# Patient Record
Sex: Male | Born: 1950
Health system: Southern US, Community
[De-identification: ages and names within clinical notes are randomized; demographics above are authoritative.]

## PROBLEM LIST (undated history)

## (undated) DIAGNOSIS — I10 Essential (primary) hypertension: Secondary | ICD-10-CM

## (undated) DIAGNOSIS — Z860100 Personal history of colon polyps, unspecified: Secondary | ICD-10-CM

## (undated) DIAGNOSIS — D509 Iron deficiency anemia, unspecified: Secondary | ICD-10-CM

## (undated) DIAGNOSIS — Z8669 Personal history of other diseases of the nervous system and sense organs: Secondary | ICD-10-CM

## (undated) DIAGNOSIS — K449 Diaphragmatic hernia without obstruction or gangrene: Secondary | ICD-10-CM

## (undated) DIAGNOSIS — Z9989 Dependence on other enabling machines and devices: Secondary | ICD-10-CM

## (undated) DIAGNOSIS — K648 Other hemorrhoids: Secondary | ICD-10-CM

## (undated) DIAGNOSIS — K219 Gastro-esophageal reflux disease without esophagitis: Secondary | ICD-10-CM

## (undated) DIAGNOSIS — Z9889 Other specified postprocedural states: Secondary | ICD-10-CM

## (undated) DIAGNOSIS — A630 Anogenital (venereal) warts: Secondary | ICD-10-CM

## (undated) DIAGNOSIS — E785 Hyperlipidemia, unspecified: Secondary | ICD-10-CM

## (undated) DIAGNOSIS — G4733 Obstructive sleep apnea (adult) (pediatric): Secondary | ICD-10-CM

## (undated) DIAGNOSIS — Z973 Presence of spectacles and contact lenses: Secondary | ICD-10-CM

## (undated) DIAGNOSIS — K5790 Diverticulosis of intestine, part unspecified, without perforation or abscess without bleeding: Secondary | ICD-10-CM

## (undated) HISTORY — PX: HEMORRHOID BANDING: SHX5850

## (undated) HISTORY — DX: Iron deficiency anemia, unspecified: D50.9

## (undated) HISTORY — PX: INGUINAL HERNIA REPAIR: SUR1180

## (undated) HISTORY — PX: RETINAL DETACHMENT SURGERY: SHX105

## (undated) HISTORY — DX: Anogenital (venereal) warts: A63.0

## (undated) HISTORY — DX: Diverticulosis of intestine, part unspecified, without perforation or abscess without bleeding: K57.90

## (undated) HISTORY — DX: Other hemorrhoids: K64.8

## (undated) HISTORY — DX: Diaphragmatic hernia without obstruction or gangrene: K44.9

## (undated) HISTORY — PX: COLONOSCOPY WITH ESOPHAGOGASTRODUODENOSCOPY (EGD): SHX5779

## (undated) HISTORY — DX: Gastro-esophageal reflux disease without esophagitis: K21.9

---

## 2003-06-10 HISTORY — PX: SHOULDER ARTHROSCOPY: SHX128

## 2004-10-24 ENCOUNTER — Ambulatory Visit: Payer: Self-pay | Admitting: Family Medicine

## 2005-05-22 ENCOUNTER — Ambulatory Visit: Payer: Self-pay | Admitting: Family Medicine

## 2010-04-25 ENCOUNTER — Ambulatory Visit (HOSPITAL_COMMUNITY): Admission: RE | Admit: 2010-04-25 | Discharge: 2010-04-26 | Payer: Self-pay | Admitting: Ophthalmology

## 2010-07-04 ENCOUNTER — Ambulatory Visit (HOSPITAL_COMMUNITY)
Admission: RE | Admit: 2010-07-04 | Discharge: 2010-07-05 | Payer: Self-pay | Source: Home / Self Care | Attending: Ophthalmology | Admitting: Ophthalmology

## 2010-07-04 LAB — BASIC METABOLIC PANEL
CO2: 22 mEq/L (ref 19–32)
Chloride: 107 mEq/L (ref 96–112)
Creatinine, Ser: 1.02 mg/dL (ref 0.4–1.5)
GFR calc Af Amer: >60 mL/min (ref >60)
Potassium: 4 mEq/L (ref 3.5–5.1)
Sodium: 138 mEq/L (ref 135–145)

## 2010-07-04 LAB — CBC
Hemoglobin: 11.6 g/dL — ABNORMAL LOW (ref 13.0–17.0)
MCH: 25.9 pg — ABNORMAL LOW (ref 26.0–34.0)
Platelets: 247 10*3/uL (ref 150–400)
RBC: 4.48 MIL/uL (ref 4.22–5.81)
WBC: 4.4 10*3/uL (ref 4.0–10.5)

## 2010-07-04 LAB — SURGICAL PCR SCREEN
MRSA, PCR: NEGATIVE
Staphylococcus aureus: NEGATIVE

## 2010-07-06 NOTE — Op Note (Signed)
  Lynch, BRUHL               ACCOUNT NO.:  [id number]  MEDICAL RECORD NO.:  [id number]          PATIENT TYPE:  OIB  LOCATION:  5127                         FACILITY:  [facility]  PHYSICIAN:  Beulah Gandy. Ashley Royalty, M.D. DATE OF BIRTH:  1950/10/29  DATE OF PROCEDURE:  07/04/2010 DATE OF DISCHARGE:                              OPERATIVE REPORT   ADMISSION DIAGNOSIS:  Recurrent rhegmatogenous retinal detachment.  PROCEDURE:  Pars plana vitrectomy and repair of complex traction retinal detachment with retinal photocoagulation, Perfluoron injection, Perfluoron removal, gas-fluid exchange, membrane peel, all in the right eye.  SURGEON:  Beulah Gandy. Ashley Royalty, MD  ASSISTANT:  Rosalie Doctor.  ANESTHESIA:  General.  DETAILS:  Usual prep and drape, 25-gauge trocars placed at 8 and 10 o'clock.  The 20-gauge sclerotomy at 2 o'clock.  Provisc placed on the corneal surface.  The pars plana vitrectomy was begun just behind the crystalline lens.  The vitrectomy was carried posteriorly and some remnants of the skirt of the vitreous were encountered.  These were carefully removed under low suction and rapid cutting.  The retina and macula was detached.  The superior retina was attached.  Perfluoron was injected slowly as the fluid egressed from the edges of the retina.  A horseshoe tear was seen at 9 o'clock, and fluid egressed from this area. As the Perfluoron reattached the retina, the horseshoe tear and other breaks were seen pressed tightly against the scleral buckle.  The endolaser was then positioned in the eye, 1286 burns were placed around the horseshoe tear and of the breaks.  The power was 1000 milliwatts, 1000 microns each, and 0.1 seconds each.  Each of the breaks were well sealed at that point.  The Perfluoron was removed and room air was inserted into the vitreous cavity.  A washout procedure where additional fluid was placed in the posterior segment was performed and then additional  removal of fluid and Perfluoron was performed.  C3F8 in a 16% concentration was exchanged for intravitreal gas.  The instruments were removed from the eye and the wounds were tested and found to be tight. A 9-0 nylon was used to close the sclerotomy at 2 o'clock.  The conjunctiva was closed with wet-field cautery.  Polymyxin and gentamicin were irrigated 15 ounces space.  Atropine solution was applied. Marcaine was injected around the globe for postop pain.  Closing pressure was 10 with a Baer keratometer, Decadron 10 mg was injected to the lower subconjunctival space.  The patient was awakened and taken to recovery in satisfactory condition.  COMPLICATIONS:  None.  DURATION:  One and a half hours.     Beulah Gandy. Ashley Royalty, M.D.     JDM/MEDQ  D:  07/04/2010  T:  07/05/2010  Job:  [id number]  Electronically Signed by Alan Mulder M.D. on 07/06/2010 25:36:64 AM

## 2010-08-20 LAB — BASIC METABOLIC PANEL
BUN: 13 mg/dL (ref 6–23)
Chloride: 106 mEq/L (ref 96–112)
GFR calc Af Amer: >60 mL/min (ref >60)
GFR calc non Af Amer: >60 mL/min (ref >60)
Potassium: 3.9 mEq/L (ref 3.5–5.1)
Sodium: 137 mEq/L (ref 135–145)

## 2010-08-20 LAB — CBC
HCT: 38.5 % — ABNORMAL LOW (ref 39.0–52.0)
MCV: 80.5 fL (ref 78.0–100.0)
RBC: 4.78 MIL/uL (ref 4.22–5.81)
RDW: 14.9 % (ref 11.5–15.5)
WBC: 4.4 10*3/uL (ref 4.0–10.5)

## 2010-08-20 LAB — SURGICAL PCR SCREEN: MRSA, PCR: NEGATIVE

## 2011-01-15 ENCOUNTER — Encounter (INDEPENDENT_AMBULATORY_CARE_PROVIDER_SITE_OTHER): Payer: BC Managed Care – PPO | Admitting: Ophthalmology

## 2011-01-15 DIAGNOSIS — H538 Other visual disturbances: Secondary | ICD-10-CM

## 2011-01-15 DIAGNOSIS — H33009 Unspecified retinal detachment with retinal break, unspecified eye: Secondary | ICD-10-CM

## 2011-01-15 DIAGNOSIS — H43819 Vitreous degeneration, unspecified eye: Secondary | ICD-10-CM

## 2011-01-15 DIAGNOSIS — H33309 Unspecified retinal break, unspecified eye: Secondary | ICD-10-CM

## 2011-04-11 ENCOUNTER — Encounter (INDEPENDENT_AMBULATORY_CARE_PROVIDER_SITE_OTHER): Payer: BC Managed Care – PPO | Admitting: Ophthalmology

## 2011-04-11 DIAGNOSIS — H33009 Unspecified retinal detachment with retinal break, unspecified eye: Secondary | ICD-10-CM

## 2011-04-11 DIAGNOSIS — H43819 Vitreous degeneration, unspecified eye: Secondary | ICD-10-CM

## 2011-04-11 DIAGNOSIS — H35359 Cystoid macular degeneration, unspecified eye: Secondary | ICD-10-CM

## 2011-04-18 ENCOUNTER — Encounter (INDEPENDENT_AMBULATORY_CARE_PROVIDER_SITE_OTHER): Payer: BC Managed Care – PPO | Admitting: Ophthalmology

## 2011-06-04 ENCOUNTER — Encounter (HOSPITAL_BASED_OUTPATIENT_CLINIC_OR_DEPARTMENT_OTHER): Payer: Self-pay | Admitting: *Deleted

## 2011-06-04 NOTE — Progress Notes (Signed)
To bring cpap and will use post op 

## 2011-06-05 NOTE — H&P (Signed)
  Date of examination:  05-19-11  Indication for surgery: 60 yo man with exotropia,excyclotropia, and horizontal and torsional diplopia, which by history developed following a scleral buckle procedure on the right eye in 1/12 for a retinal detachment, admitted for eye muscle surgery to straighten the eyes and relieve diplopia.  Pertinent past medical history:  Past Medical History  Diagnosis Date  . Detached retina, bilateral   . Detached retina, right 1/12  . Hypertension   . Hyperlipemia   . Sleep apnea     uses cpap    Pertinent ocular history:  As above. Pt has also undergone laser treatment to the left eye for a "partial detachment," and cataract extraction/lens implantation to the right eye 5/12.  Hx myopia.  Wears toric CL OS; chooses not to wear CL OD because it "enhances the double vision."  Social:  Pilot  Pertinent family history: History reviewed. No pertinent family history.  General:  Healthy appearing patient in no distress.    Eyes:    Acuity cc  [place]  OD 20/60  OS 20/30  Pupil:  OD trace irreg, trace reactive, no APD  External: Within normal limits     Anterior segment: Within normal limits   PCIOL OD.  Lens clear OS  Motility:   RXT = 30, RXT' = 40.  Small "V". DMR 10 degrees excyclo.  1- lim of elev OD.  Rots   o/w nl  Fundus: Deferred--followed by Ashley Royalty  Refraction:   [place]  OD +2.50 + 0.50 x 122 = 20/60  OS wears CL, ? power  Heart: Regular rate and rhythm without murmur     Lungs: Clear to auscultation     Abdomen: Soft, nontender, normal bowel sounds     Impression:XT, CI type, with excyclotropia but no hypertropia.  S/p intrascleral buckle per Ashley Royalty, so the buckle should not be interfering with the EOM's.  Presume the XT is sensory; can't explain apparent RSO weakness with significant torsion but minimal hypertropia; no history of head trauma (for example) to suggest bilateral SO palsy.  Plan: RLR recess and RMR resect for XT; Artelia Laroche for  torsion.  May transpose RLR inferiorly and RMR superiorly for additional torsional effect.  Have explained to pt that the torsion and the incomitance in this case mean that it is exceedingly unlikely that he will have a large field of single binocular vision.  Hope for SBV in primary and in reading position.  Even that may take prism and/or > 1 surgery.  Could end up needing opaque CL to occlude OD.  Pt understands and wishes to proceed.  Shara Blazing

## 2011-06-06 ENCOUNTER — Ambulatory Visit (HOSPITAL_BASED_OUTPATIENT_CLINIC_OR_DEPARTMENT_OTHER): Payer: BC Managed Care – PPO | Admitting: Anesthesiology

## 2011-06-06 ENCOUNTER — Encounter (HOSPITAL_BASED_OUTPATIENT_CLINIC_OR_DEPARTMENT_OTHER): Payer: Self-pay | Admitting: Anesthesiology

## 2011-06-06 ENCOUNTER — Encounter (HOSPITAL_BASED_OUTPATIENT_CLINIC_OR_DEPARTMENT_OTHER)
Admission: RE | Disposition: A | Discharge status: Home / Self Care | Payer: Self-pay | Source: Ambulatory Visit | Attending: Ophthalmology

## 2011-06-06 ENCOUNTER — Encounter (HOSPITAL_BASED_OUTPATIENT_CLINIC_OR_DEPARTMENT_OTHER): Payer: Self-pay

## 2011-06-06 ENCOUNTER — Ambulatory Visit (HOSPITAL_BASED_OUTPATIENT_CLINIC_OR_DEPARTMENT_OTHER)
Admission: RE | Admit: 2011-06-06 | Discharge: 2011-06-06 | Disposition: A | Discharge status: Home / Self Care | Payer: BC Managed Care – PPO | Source: Ambulatory Visit | Attending: Ophthalmology | Admitting: Ophthalmology

## 2011-06-06 DIAGNOSIS — IMO0002 Reserved for concepts with insufficient information to code with codable children: Secondary | ICD-10-CM | POA: Insufficient documentation

## 2011-06-06 DIAGNOSIS — E669 Obesity, unspecified: Secondary | ICD-10-CM | POA: Insufficient documentation

## 2011-06-06 DIAGNOSIS — H5010 Unspecified exotropia: Secondary | ICD-10-CM | POA: Insufficient documentation

## 2011-06-06 DIAGNOSIS — I10 Essential (primary) hypertension: Secondary | ICD-10-CM | POA: Insufficient documentation

## 2011-06-06 HISTORY — DX: Essential (primary) hypertension: I10

## 2011-06-06 HISTORY — PX: STRABISMUS SURGERY: SHX218

## 2011-06-06 LAB — POCT I-STAT, CHEM 8
BUN: 14 mg/dL (ref 6–23)
Hemoglobin: 12.2 g/dL — ABNORMAL LOW (ref 13.0–17.0)
Potassium: 3.9 mEq/L (ref 3.5–5.1)
Sodium: 140 mEq/L (ref 135–145)
TCO2: 25 mmol/L (ref 0–100)

## 2011-06-06 SURGERY — REPAIR STRABISMUS
Anesthesia: General | Laterality: Right | Wound class: Clean

## 2011-06-06 MED ORDER — PROPOFOL 10 MG/ML IV EMUL
INTRAVENOUS | Status: DC | PRN
  Administered 2011-06-06: 200 mg via INTRAVENOUS

## 2011-06-06 MED ORDER — ONDANSETRON HCL 4 MG/2ML IJ SOLN: 4.0000 mg | Freq: Once | INTRAMUSCULAR | Status: DC | PRN

## 2011-06-06 MED ORDER — PHENYLEPHRINE HCL 2.5 % OP SOLN
OPHTHALMIC | Status: DC | PRN
  Administered 2011-06-06: 2 [drp] via OPHTHALMIC

## 2011-06-06 MED ORDER — LIDOCAINE HCL (CARDIAC) 20 MG/ML IV SOLN
INTRAVENOUS | Status: DC | PRN
  Administered 2011-06-06: 60 mg via INTRAVENOUS

## 2011-06-06 MED ORDER — EPHEDRINE SULFATE 50 MG/ML IJ SOLN
INTRAMUSCULAR | Status: DC | PRN
  Administered 2011-06-06: 15 mg via INTRAVENOUS

## 2011-06-06 MED ORDER — FENTANYL CITRATE 0.05 MG/ML IJ SOLN
INTRAMUSCULAR | Status: DC | PRN
  Administered 2011-06-06 (×2): 25 ug via INTRAVENOUS
  Administered 2011-06-06: 50 ug via INTRAVENOUS
  Administered 2011-06-06: 25 ug via INTRAVENOUS

## 2011-06-06 MED ORDER — TOBRAMYCIN-DEXAMETHASONE 0.3-0.1 % OP OINT
TOPICAL_OINTMENT | OPHTHALMIC | Status: DC | PRN
  Administered 2011-06-06: 1 via OPHTHALMIC

## 2011-06-06 MED ORDER — DEXAMETHASONE SODIUM PHOSPHATE 4 MG/ML IJ SOLN
INTRAMUSCULAR | Status: DC | PRN
  Administered 2011-06-06: 10 mg via INTRAVENOUS

## 2011-06-06 MED ORDER — ONDANSETRON HCL 4 MG/2ML IJ SOLN
INTRAMUSCULAR | Status: DC | PRN
  Administered 2011-06-06: 4 mg via INTRAVENOUS

## 2011-06-06 MED ORDER — KETOROLAC TROMETHAMINE 30 MG/ML IJ SOLN
INTRAMUSCULAR | Status: DC | PRN
  Administered 2011-06-06: 30 mg via INTRAVENOUS

## 2011-06-06 MED ORDER — HYDROMORPHONE HCL PF 1 MG/ML IJ SOLN: 0.2500 mg | INTRAMUSCULAR | Status: DC | PRN

## 2011-06-06 MED ORDER — TROPICAMIDE 1 % OP SOLN: 1.0000 [drp] | Freq: Once | OPHTHALMIC | Status: DC

## 2011-06-06 MED ORDER — ATROPINE SULFATE 0.4 MG/ML IJ SOLN
INTRAMUSCULAR | Status: DC | PRN
  Administered 2011-06-06: 0.4 mg via INTRAVENOUS

## 2011-06-06 MED ORDER — PHENYLEPHRINE HCL 2.5 % OP SOLN
1.0000 [drp] | Freq: Once | OPHTHALMIC | Status: AC
  Administered 2011-06-06: 1 [drp] via OPHTHALMIC

## 2011-06-06 MED ORDER — MORPHINE SULFATE 2 MG/ML IJ SOLN: 0.0500 mg/kg | INTRAMUSCULAR | Status: DC | PRN

## 2011-06-06 MED ORDER — MEPERIDINE HCL 25 MG/ML IJ SOLN: 6.2500 mg | INTRAMUSCULAR | Status: DC | PRN

## 2011-06-06 MED ORDER — LACTATED RINGERS IV SOLN
INTRAVENOUS | Status: DC
  Administered 2011-06-06: 08:00:00 via INTRAVENOUS

## 2011-06-06 MED ORDER — MIDAZOLAM HCL 5 MG/5ML IJ SOLN
INTRAMUSCULAR | Status: DC | PRN
  Administered 2011-06-06: 2 mg via INTRAVENOUS

## 2011-06-06 MED ORDER — CYCLOPENTOLATE HCL 1 % OP SOLN
1.0000 [drp] | OPHTHALMIC | Status: AC
  Administered 2011-06-06 (×2): 1 [drp] via OPHTHALMIC

## 2011-06-06 MED ORDER — BSS IO SOLN
INTRAOCULAR | Status: DC | PRN
  Administered 2011-06-06: 20 mL via INTRAOCULAR

## 2011-06-06 MED ORDER — TOBRAMYCIN-DEXAMETHASONE 0.3-0.1 % OP OINT: TOPICAL_OINTMENT | Freq: Two times a day (BID) | OPHTHALMIC | Status: AC

## 2011-06-06 SURGICAL SUPPLY — 31 items
APL SRG 3 HI ABS STRL LF PLS (MISCELLANEOUS) ×1
APPLICATOR COTTON TIP 6IN STRL (MISCELLANEOUS) ×8 IMPLANT
APPLICATOR DR MATTHEWS STRL (MISCELLANEOUS) ×2 IMPLANT
CAUTERY EYE LOW TEMP 1300F FIN (OPHTHALMIC RELATED) IMPLANT
CLOTH BEACON ORANGE TIMEOUT ST (SAFETY) ×2 IMPLANT
COVER MAYO STAND STRL (DRAPES) ×2 IMPLANT
COVER SURGICAL LIGHT HANDLE (MISCELLANEOUS) ×2 IMPLANT
COVER TABLE BACK 60X90 (DRAPES) ×2 IMPLANT
DRAPE SURG 17X23 STRL (DRAPES) ×4 IMPLANT
DRAPE U-SHAPE 76X120 STRL (DRAPES) IMPLANT
GLOVE BIO SURGEON STRL SZ 6.5 (GLOVE) ×2 IMPLANT
GLOVE BIOGEL M STRL SZ7.5 (GLOVE) ×4 IMPLANT
GOWN BRE IMP PREV XXLGXLNG (GOWN DISPOSABLE) ×2 IMPLANT
GOWN PREVENTION PLUS XLARGE (GOWN DISPOSABLE) ×2 IMPLANT
NS IRRIG 1000ML POUR BTL (IV SOLUTION) ×2 IMPLANT
PACK BASIN DAY SURGERY FS (CUSTOM PROCEDURE TRAY) ×2 IMPLANT
PAD EYE OVAL STERILE LF (GAUZE/BANDAGES/DRESSINGS) IMPLANT
SHEET MEDIUM DRAPE 40X70 STRL (DRAPES) ×2 IMPLANT
SPEAR EYE SURG WECK-CEL (MISCELLANEOUS) ×12 IMPLANT
STRIP CLOSURE SKIN 1/4X4 (GAUZE/BANDAGES/DRESSINGS) IMPLANT
SUT 6 0 SILK T G140 8DA (SUTURE) IMPLANT
SUT MERSILENE 6 0 S14 DA (SUTURE) ×2 IMPLANT
SUT PLAIN 6 0 TG1408 (SUTURE) ×2 IMPLANT
SUT SILK 4 0 C 3 735G (SUTURE) IMPLANT
SUT VICRYL 6 0 S 28 (SUTURE) ×2 IMPLANT
SUT VICRYL ABS 6-0 S29 18IN (SUTURE) ×2 IMPLANT
SYRINGE 10CC LL (SYRINGE) ×2 IMPLANT
TOWEL OR 17X24 6PK STRL BLUE (TOWEL DISPOSABLE) ×2 IMPLANT
TOWEL OR NON WOVEN STRL DISP B (DISPOSABLE) ×2 IMPLANT
TRAY DSU PREP LF (CUSTOM PROCEDURE TRAY) ×2 IMPLANT
WATER STERILE IRR 1000ML POUR (IV SOLUTION) ×2 IMPLANT

## 2011-06-06 NOTE — Brief Op Note (Signed)
06/06/2011  11:41 AM  PATIENT:  Shane Lynch  60 y.o. male  PRE-OPERATIVE DIAGNOSIS:  Exotropia and excyclotropia,  right eye.  Hx scleral buckle  POST-OPERATIVE DIAGNOSIS:  same  PROCEDURE:  Procedure(s): 1.  Right Artelia Laroche (not [place]) 2.  Right lateral rectus muscle recession, 7.0 mm, with full tendon width downshift 3.  Right medial rectus muscle resection, 6.0 mm, with full tendon width upshift.  SURGEON:  Surgeon(s): Pasty Spillers Kymari Lollis  PHYSICIAN ASSISTANT:   ASSISTANTS: none   ANESTHESIA:   general  EBL:  Total I/O In: 2100 [I.V.:2100] Out: -   BLOOD ADMINISTERED:none  DRAINS: none   LOCAL MEDICATIONS USED:  NONE  SPECIMEN:  No Specimen  DISPOSITION OF SPECIMEN:  N/A  COUNTS:  YES  TOURNIQUET:  * No tourniquets in log *  DICTATION: .Note written in EPIC  PLAN OF CARE: Discharge to home after PACU  PATIENT DISPOSITION:  PACU - hemodynamically stable.

## 2011-06-06 NOTE — Anesthesia Preprocedure Evaluation (Addendum)
Anesthesia Evaluation  Patient identified by MRN, date of birth, ID band Patient awake    Reviewed: Allergy & Precautions, H&P , NPO status , Patient's Chart, lab work & pertinent test results  Airway Mallampati: I TM Distance: >3 FB Neck ROM: Full    Dental  (+) Teeth Intact and Dental Advisory Given   Pulmonary  Uses CPAP nightly clear to auscultation  Pulmonary exam normal       Cardiovascular hypertension, Regular Normal    Neuro/Psych    GI/Hepatic   Endo/Other  Morbid obesity  Renal/GU      Musculoskeletal   Abdominal   Peds  Hematology   Anesthesia Other Findings   Reproductive/Obstetrics                      Anesthesia Physical Anesthesia Plan  ASA: III  Anesthesia Plan: General   Post-op Pain Management:    Induction: Intravenous  Airway Management Planned: LMA  Additional Equipment:   Intra-op Plan:   Post-operative Plan: Extubation in OR  Informed Consent: I have reviewed the patients History and Physical, chart, labs and discussed the procedure including the risks, benefits and alternatives for the proposed anesthesia with the patient or authorized representative who has indicated his/her understanding and acceptance.   Dental advisory given  Plan Discussed with: CRNA, Anesthesiologist and Surgeon  Anesthesia Plan Comments:         Anesthesia Quick Evaluation

## 2011-06-06 NOTE — Op Note (Signed)
Preoperative diagnosis:  #1 exotropia #2 excyclotropia  #3 status post scleral buckle to repair retinal detachment, right eye  Postoperative diagnosis: Same  Procedure: #1.  Right Harada ito procedure #2 right lateral rectus muscle recession, 7.0 mm, with full tendon width downshift #3 right medial rectus muscle resection, 6.0 mm, with full tendon width upshift  Surgeon: Pasty Spillers Julien Oscar M.D.  Anesthesia: Gen. (laryngeal mask)  Complications: None  Description of procedure: After preoperative evaluation including informed consent, which included a discussion of the fact that it is extremely unlikely to we'll to achieve single binocular vision over a wide range of positions of gaze, and that the goal is single vision in primary position and in the reading position, and that even that might require further surgery and/or prism, the patient was taken to the operating room where he was identified by me. General anesthesia was induced without difficulty after placement of appropriate monitors. Indirect ophthalmoscopy was performed, showing no torsion of the left eye but significant ex torsion of the right eye. The patient was then prepped and draped in standard sterile fashion. A lid speculum was placed in the right eye.  Through a superotemporal fornix incision through conjunctiva and Tenon's fascia, the right superior rectus muscle was engaged on a series of muscle hooks. This was challenging, because the muscle itself was scarred to sclera into the underlying scleral buckle. It was necessary to the muscle posterior to its actual insertion and then by blunt dissection free the adhesions of the muscle to sclera. After extensive searching it was not possible to find the superior oblique tendon insertion in the usual location. Ultimately it was decided to find the superior oblique tendon nasal to the superior rectus muscle. This was done, and a suture was passed under the superior oblique tendon. The  suture was then drawn under the superior rectus muscle and followed to the insertion of the superior oblique tendon, which was quite posterior and directed posteriorly it was not possible reliably to identify anterior versus posterior fibers of the superior oblique tendon. Thus it was decided to bring the entire tendon anteriorly and temporally, securing the tendon to sclera with a double-armed 60 M[place]ilene suture approximately 4 mm superior and 1 mm posterior to the superior border of the lateral rectus insertion. The lateral rectus muscle was then engaged on a series of muscle hooks. Again, it was necessary to hook the muscle well posterior to the actual insertion and then dissected bluntly between the muscle and the scleral buckle to reach the actual insertion. The muscle was cleared of its surrounding fascial attachments of scar tissue. The tendon was secured with a double-armed 6-0 Vicryl suture, with a double locking bite at each border of the muscle, 1 mm from the insertion. The muscle was disinserted. It was reattached to sclera at a measured distance of 3.5 mm posterior to the current insertion, it just at the anterior border of the scleral buckle, with sutures passed into sclera in crossed swords fashion noted each pole was transposed a full tendon with Inferiorly, so that the superior pole was placed directly posterior to the inferior end of the current insertion and the inferior pole was placed one tendon width inferior to the superior pole. The muscle was drawn up until all slackened and removed. The pole sutures were joined together with a needle driver at a measured distance of 3.5 mm above sclera and were tied together. The muscle was then allowed to hang back until not reached sclera, creating  a hemi-hang back recession of 7.0 mm, with a full tendon width downshift. The conjunctival incision was closed with three 6-0 plain gut sutures. Through a superonasal fornix incision through conjunctiva and  Tenon fascia, the right medial rectus muscle was engaged on a series of muscle hooks and cleared of its surrounding fascial attachments and scar tissue. Again, it was necessary to the muscle well posterior to its actual insertion and dissected bluntly between the muscle and the sclera and the scleral buckle to reach the insertion. The muscle was spread between 2 self-retaining hooks.. A 2 mm bite was taken of the center of the muscle belly at a measured distance of 6.0 mm posterior to the insertion, and a knot was tied securely at this location. The needle at each end of the double-armed sutures passed from the center of the muscle belly to the periphery, parallel to and 6.0 mm posterior to the insertion a double locking bite was placed at each border of the muscle. A resection clamp was placed on the muscle just anterior to the sutures. The muscle was disinserted. It was reattached to sclera 5 mm posterior to the limbus with each pole transposed 1 full tendon width superiorly bypassing each pole suture posteriorly to anteriorly in radial fashion at a location corresponding to the new transposed position, then anteriorly to posteriorly near the center, then posteriorly to anteriorly through the center of the muscle belly and just posterior to the previously placed knot. The muscle was drawn up to the level of the new insertion, taking care to remove all slack.  The suture ends were tied securely. The resection clamp was removed. The portion of the muscle anterior to the sutures was carefully excised. Conjunctiva was closed with two six oh plain gut sutures. Indirect ophthalmoscopy was attempted, to assess the amount of torsional correction had been achieved. The view was hazy and really insufficient to accurately assess portion. TobraDex ointment was placed in the eye. The patient was awakened without difficulty and taken to the recovery room in stable condition, having suffered no intraoperative or immediate  postoperative complications.

## 2011-06-06 NOTE — Anesthesia Procedure Notes (Signed)
Procedure Name: LMA Insertion Performed by: Sharyne Richters Pre-anesthesia Checklist: Patient identified, Timeout performed, Emergency Drugs available, Suction available and Patient being monitored Patient Re-evaluated:Patient Re-evaluated prior to inductionOxygen Delivery Method: Circle System Utilized Preoxygenation: Pre-oxygenation with 100% oxygen Intubation Type: IV induction Ventilation: Mask ventilation with difficulty LMA: LMA flexible inserted LMA Size: 5.0 Number of attempts: 1 Placement Confirmation: breath sounds checked- equal and bilateral and positive ETCO2 Tube secured with: Tape Dental Injury: Teeth and Oropharynx as per pre-operative assessment

## 2011-06-06 NOTE — Anesthesia Postprocedure Evaluation (Signed)
Anesthesia Post Note  Patient: Shane Lynch  Procedure(s) Performed:  REPAIR STRABISMUS  Anesthesia type: General  Patient location: PACU  Post pain: Pain level controlled  Post assessment: Patient's Cardiovascular Status Stable  Last Vitals:  Filed Vitals:   06/06/11 1215  BP: 132/65  Pulse: 78  Temp:   Resp: 16    Post vital signs: Reviewed and stable  Level of consciousness: alert  Complications: No apparent anesthesia complications

## 2011-06-06 NOTE — Transfer of Care (Signed)
Immediate Anesthesia Transfer of Care Note  Patient: Shane Lynch  Procedure(s) Performed:  REPAIR STRABISMUS  Patient Location: PACU  Anesthesia Type: General  Level of Consciousness: awake, alert  and oriented  Airway & Oxygen Therapy: Patient Spontanous Breathing and Patient connected to face mask oxygen  Post-op Assessment: Report given to PACU RN and Post -op Vital signs reviewed and stable  Post vital signs: Reviewed and stable  Complications: No apparent anesthesia complications

## 2011-06-09 ENCOUNTER — Encounter (HOSPITAL_BASED_OUTPATIENT_CLINIC_OR_DEPARTMENT_OTHER): Payer: Self-pay | Admitting: Ophthalmology

## 2011-10-09 ENCOUNTER — Ambulatory Visit (INDEPENDENT_AMBULATORY_CARE_PROVIDER_SITE_OTHER): Payer: BC Managed Care – PPO | Admitting: Ophthalmology

## 2011-10-09 DIAGNOSIS — H33309 Unspecified retinal break, unspecified eye: Secondary | ICD-10-CM

## 2011-10-09 DIAGNOSIS — H43819 Vitreous degeneration, unspecified eye: Secondary | ICD-10-CM

## 2011-10-09 DIAGNOSIS — H2510 Age-related nuclear cataract, unspecified eye: Secondary | ICD-10-CM

## 2011-10-09 DIAGNOSIS — H35379 Puckering of macula, unspecified eye: Secondary | ICD-10-CM

## 2011-10-09 DIAGNOSIS — H33009 Unspecified retinal detachment with retinal break, unspecified eye: Secondary | ICD-10-CM

## 2012-07-14 ENCOUNTER — Ambulatory Visit (INDEPENDENT_AMBULATORY_CARE_PROVIDER_SITE_OTHER): Payer: BC Managed Care – PPO | Admitting: Ophthalmology

## 2012-07-14 DIAGNOSIS — H35039 Hypertensive retinopathy, unspecified eye: Secondary | ICD-10-CM

## 2012-07-14 DIAGNOSIS — H43819 Vitreous degeneration, unspecified eye: Secondary | ICD-10-CM

## 2012-07-14 DIAGNOSIS — H33009 Unspecified retinal detachment with retinal break, unspecified eye: Secondary | ICD-10-CM

## 2012-07-14 DIAGNOSIS — H2510 Age-related nuclear cataract, unspecified eye: Secondary | ICD-10-CM

## 2012-07-14 DIAGNOSIS — I10 Essential (primary) hypertension: Secondary | ICD-10-CM

## 2012-07-14 DIAGNOSIS — H33309 Unspecified retinal break, unspecified eye: Secondary | ICD-10-CM

## 2013-04-09 HISTORY — PX: CATARACT EXTRACTION W/ INTRAOCULAR LENS IMPLANT: SHX1309

## 2013-04-20 ENCOUNTER — Encounter (INDEPENDENT_AMBULATORY_CARE_PROVIDER_SITE_OTHER): Payer: BC Managed Care – PPO | Admitting: Ophthalmology

## 2013-04-20 DIAGNOSIS — H33309 Unspecified retinal break, unspecified eye: Secondary | ICD-10-CM

## 2013-04-20 DIAGNOSIS — H2510 Age-related nuclear cataract, unspecified eye: Secondary | ICD-10-CM

## 2013-04-20 DIAGNOSIS — H43819 Vitreous degeneration, unspecified eye: Secondary | ICD-10-CM

## 2013-04-20 DIAGNOSIS — H33009 Unspecified retinal detachment with retinal break, unspecified eye: Secondary | ICD-10-CM

## 2013-05-20 ENCOUNTER — Encounter (INDEPENDENT_AMBULATORY_CARE_PROVIDER_SITE_OTHER): Payer: BC Managed Care – PPO | Admitting: Ophthalmology

## 2013-06-16 ENCOUNTER — Other Ambulatory Visit (HOSPITAL_COMMUNITY): Payer: Self-pay | Admitting: *Deleted

## 2013-06-16 ENCOUNTER — Encounter (HOSPITAL_COMMUNITY): Payer: Self-pay | Admitting: *Deleted

## 2013-06-16 NOTE — Progress Notes (Signed)
When I called pt for pre-op call, pt was very surprised and upset. He had not been told he was having surgery, states he has not even seen Dr. Ashley RoyaltyMatthews yet. Has an appt with him at 7:30 am Friday. He states that my phone call was the first he had heard anything about surgery and he didn't want me asking questions yet. He asked if I could get in touch with Dr. Ashley RoyaltyMatthews and have him call pt. I attempted to reach Dr. Ashley RoyaltyMatthews, but he is not on call. I called Mr Nelida MeuseMilliner back and told him this. I told him to see Dr. Ashley RoyaltyMatthews in the am and once it was determined that he was going to have surgery, then Dr. Ashley RoyaltyMatthews could give him instructions. Pt then asked if it would be better to go ahead and do the health hx just in case. I told him yes and I would be glad to do it now. He then verified his allergies, meds, medical and surgical hx. He did not want me to go over any instructions at this time. So, none were given.

## 2013-06-17 ENCOUNTER — Ambulatory Visit (HOSPITAL_COMMUNITY)
Admission: RE | Admit: 2013-06-17 | Discharge: 2013-06-18 | Disposition: A | Discharge status: Home / Self Care | Payer: BC Managed Care – PPO | Source: Ambulatory Visit | Attending: Ophthalmology | Admitting: Ophthalmology

## 2013-06-17 ENCOUNTER — Encounter (HOSPITAL_COMMUNITY): Payer: BC Managed Care – PPO | Admitting: Certified Registered Nurse Anesthetist

## 2013-06-17 ENCOUNTER — Ambulatory Visit (HOSPITAL_COMMUNITY): Payer: BC Managed Care – PPO | Admitting: Certified Registered Nurse Anesthetist

## 2013-06-17 ENCOUNTER — Encounter (HOSPITAL_COMMUNITY): Payer: Self-pay

## 2013-06-17 ENCOUNTER — Ambulatory Visit (HOSPITAL_COMMUNITY): Payer: BC Managed Care – PPO

## 2013-06-17 ENCOUNTER — Encounter (INDEPENDENT_AMBULATORY_CARE_PROVIDER_SITE_OTHER): Payer: BC Managed Care – PPO | Admitting: Ophthalmology

## 2013-06-17 ENCOUNTER — Encounter (HOSPITAL_COMMUNITY)
Admission: RE | Disposition: A | Discharge status: Home / Self Care | Payer: Self-pay | Source: Ambulatory Visit | Attending: Ophthalmology

## 2013-06-17 DIAGNOSIS — H33002 Unspecified retinal detachment with retinal break, left eye: Secondary | ICD-10-CM | POA: Diagnosis present

## 2013-06-17 DIAGNOSIS — E785 Hyperlipidemia, unspecified: Secondary | ICD-10-CM | POA: Insufficient documentation

## 2013-06-17 DIAGNOSIS — H33009 Unspecified retinal detachment with retinal break, unspecified eye: Secondary | ICD-10-CM | POA: Insufficient documentation

## 2013-06-17 DIAGNOSIS — G4730 Sleep apnea, unspecified: Secondary | ICD-10-CM | POA: Insufficient documentation

## 2013-06-17 DIAGNOSIS — H35039 Hypertensive retinopathy, unspecified eye: Secondary | ICD-10-CM

## 2013-06-17 DIAGNOSIS — I10 Essential (primary) hypertension: Secondary | ICD-10-CM

## 2013-06-17 DIAGNOSIS — H43819 Vitreous degeneration, unspecified eye: Secondary | ICD-10-CM

## 2013-06-17 HISTORY — PX: SCLERAL BUCKLE: SHX5340

## 2013-06-17 LAB — CBC
HEMATOCRIT: 32 % — AB (ref 39.0–52.0)
Hemoglobin: 9.5 g/dL — ABNORMAL LOW (ref 13.0–17.0)
MCH: 21.4 pg — ABNORMAL LOW (ref 26.0–34.0)
MCHC: 29.7 g/dL — ABNORMAL LOW (ref 30.0–36.0)
MCV: 72.1 fL — ABNORMAL LOW (ref 78.0–100.0)
Platelets: 249 10*3/uL (ref 150–400)
RBC: 4.44 MIL/uL (ref 4.22–5.81)
RDW: 18.5 % — AB (ref 11.5–15.5)
WBC: 5.9 10*3/uL (ref 4.0–10.5)

## 2013-06-17 LAB — BASIC METABOLIC PANEL
BUN: 12 mg/dL (ref 6–23)
CHLORIDE: 115 meq/L — AB (ref 96–112)
CO2: 23 mEq/L (ref 19–32)
CREATININE: 0.88 mg/dL (ref 0.50–1.35)
Calcium: 9.3 mg/dL (ref 8.4–10.5)
Glucose, Bld: 106 mg/dL — ABNORMAL HIGH (ref 70–99)
Potassium: 3.8 mEq/L (ref 3.7–5.3)
Sodium: 122 mEq/L — ABNORMAL LOW (ref 137–147)

## 2013-06-17 LAB — SODIUM: SODIUM: 142 meq/L (ref 137–147)

## 2013-06-17 SURGERY — SCLERAL BUCKLE
Anesthesia: General | Laterality: Left

## 2013-06-17 MED ORDER — CYCLOPENTOLATE HCL 1 % OP SOLN
1.0000 [drp] | OPHTHALMIC | Status: AC | PRN
  Administered 2013-06-17 (×3): 1 [drp] via OPHTHALMIC
  Filled 2013-06-17: qty 2

## 2013-06-17 MED ORDER — CEFAZOLIN SODIUM-DEXTROSE 2-3 GM-% IV SOLR
2.0000 g | INTRAVENOUS | Status: AC
  Administered 2013-06-17: 2 g via INTRAVENOUS
  Filled 2013-06-17: qty 50

## 2013-06-17 MED ORDER — FENTANYL CITRATE 0.05 MG/ML IJ SOLN
INTRAMUSCULAR | Status: DC | PRN
  Administered 2013-06-17 (×5): 50 ug via INTRAVENOUS

## 2013-06-17 MED ORDER — LATANOPROST 0.005 % OP SOLN
1.0000 [drp] | Freq: Every day | OPHTHALMIC | Status: DC
  Filled 2013-06-17: qty 2.5

## 2013-06-17 MED ORDER — HYDROCODONE-ACETAMINOPHEN 5-325 MG PO TABS
1.0000 | ORAL_TABLET | ORAL | Status: DC | PRN
  Administered 2013-06-17 – 2013-06-18 (×2): 2 via ORAL
  Filled 2013-06-17 (×2): qty 2

## 2013-06-17 MED ORDER — HYDROMORPHONE HCL PF 1 MG/ML IJ SOLN
INTRAMUSCULAR | Status: AC
  Filled 2013-06-17: qty 1

## 2013-06-17 MED ORDER — ONDANSETRON HCL 4 MG/2ML IJ SOLN
INTRAMUSCULAR | Status: DC | PRN
  Administered 2013-06-17: 4 mg via INTRAVENOUS

## 2013-06-17 MED ORDER — BACITRACIN-POLYMYXIN B 500-10000 UNIT/GM OP OINT
TOPICAL_OINTMENT | OPHTHALMIC | Status: AC
  Filled 2013-06-17: qty 3.5

## 2013-06-17 MED ORDER — ACETAMINOPHEN 325 MG PO TABS: 325.0000 mg | ORAL_TABLET | ORAL | Status: DC | PRN

## 2013-06-17 MED ORDER — LISINOPRIL-HYDROCHLOROTHIAZIDE 10-12.5 MG PO TABS: 1.0000 | ORAL_TABLET | Freq: Every day | ORAL | Status: DC

## 2013-06-17 MED ORDER — BACITRACIN-POLYMYXIN B 500-10000 UNIT/GM OP OINT
TOPICAL_OINTMENT | OPHTHALMIC | Status: DC | PRN
  Administered 2013-06-17: 1 via OPHTHALMIC

## 2013-06-17 MED ORDER — BSS IO SOLN
INTRAOCULAR | Status: DC | PRN
  Administered 2013-06-17: 15 mL via INTRAOCULAR

## 2013-06-17 MED ORDER — ONDANSETRON HCL 4 MG/2ML IJ SOLN: 4.0000 mg | Freq: Four times a day (QID) | INTRAMUSCULAR | Status: DC | PRN

## 2013-06-17 MED ORDER — TEMAZEPAM 15 MG PO CAPS: 15.0000 mg | ORAL_CAPSULE | Freq: Every evening | ORAL | Status: DC | PRN

## 2013-06-17 MED ORDER — LIDOCAINE HCL (CARDIAC) 20 MG/ML IV SOLN
INTRAVENOUS | Status: DC | PRN
  Administered 2013-06-17: 40 mg via INTRAVENOUS

## 2013-06-17 MED ORDER — NEOSTIGMINE METHYLSULFATE 1 MG/ML IJ SOLN
INTRAMUSCULAR | Status: DC | PRN
  Administered 2013-06-17: 5 mg via INTRAVENOUS

## 2013-06-17 MED ORDER — DOCUSATE SODIUM 100 MG PO CAPS
100.0000 mg | ORAL_CAPSULE | Freq: Two times a day (BID) | ORAL | Status: DC
  Administered 2013-06-17: 100 mg via ORAL
  Filled 2013-06-17: qty 1

## 2013-06-17 MED ORDER — DEXAMETHASONE SODIUM PHOSPHATE 10 MG/ML IJ SOLN
INTRAMUSCULAR | Status: AC
  Filled 2013-06-17: qty 1

## 2013-06-17 MED ORDER — TRIAMCINOLONE ACETONIDE 40 MG/ML IJ SUSP
INTRAMUSCULAR | Status: AC
  Filled 2013-06-17: qty 5

## 2013-06-17 MED ORDER — LISINOPRIL 10 MG PO TABS
10.0000 mg | ORAL_TABLET | Freq: Every day | ORAL | Status: DC
  Filled 2013-06-17: qty 1

## 2013-06-17 MED ORDER — GATIFLOXACIN 0.5 % OP SOLN
1.0000 [drp] | Freq: Four times a day (QID) | OPHTHALMIC | Status: DC
  Filled 2013-06-17: qty 2.5

## 2013-06-17 MED ORDER — BACITRACIN-POLYMYXIN B 500-10000 UNIT/GM OP OINT
1.0000 "application " | TOPICAL_OINTMENT | Freq: Four times a day (QID) | OPHTHALMIC | Status: DC
  Filled 2013-06-17: qty 3.5

## 2013-06-17 MED ORDER — HYDROCHLOROTHIAZIDE 12.5 MG PO CAPS
12.5000 mg | ORAL_CAPSULE | Freq: Every day | ORAL | Status: DC
  Filled 2013-06-17: qty 1

## 2013-06-17 MED ORDER — PROPOFOL 10 MG/ML IV BOLUS
INTRAVENOUS | Status: DC | PRN
  Administered 2013-06-17: 200 mg via INTRAVENOUS

## 2013-06-17 MED ORDER — ATORVASTATIN CALCIUM 20 MG PO TABS
20.0000 mg | ORAL_TABLET | Freq: Every day | ORAL | Status: DC
  Filled 2013-06-17: qty 1

## 2013-06-17 MED ORDER — LIDOCAINE HCL 2 % IJ SOLN
INTRAMUSCULAR | Status: AC
  Filled 2013-06-17: qty 20

## 2013-06-17 MED ORDER — HYPROMELLOSE (GONIOSCOPIC) 2.5 % OP SOLN
OPHTHALMIC | Status: AC
  Filled 2013-06-17: qty 15

## 2013-06-17 MED ORDER — SODIUM CHLORIDE 0.45 % IV SOLN
INTRAVENOUS | Status: DC
  Administered 2013-06-17: 20 mL/h via INTRAVENOUS

## 2013-06-17 MED ORDER — POLYMYXIN B SULFATE 500000 UNITS IJ SOLR
INTRAMUSCULAR | Status: AC
  Filled 2013-06-17: qty 1

## 2013-06-17 MED ORDER — DEXAMETHASONE SODIUM PHOSPHATE 10 MG/ML IJ SOLN
INTRAMUSCULAR | Status: DC | PRN
  Administered 2013-06-17: 10 mg via INTRAVENOUS

## 2013-06-17 MED ORDER — ACETAZOLAMIDE SODIUM 500 MG IJ SOLR
500.0000 mg | Freq: Once | INTRAMUSCULAR | Status: AC
  Administered 2013-06-18: 500 mg via INTRAVENOUS
  Filled 2013-06-17: qty 500

## 2013-06-17 MED ORDER — ACETAZOLAMIDE SODIUM 500 MG IJ SOLR
INTRAMUSCULAR | Status: AC
  Filled 2013-06-17: qty 500

## 2013-06-17 MED ORDER — ACETAZOLAMIDE SODIUM 500 MG IJ SOLR: 500.0000 mg | Freq: Once | INTRAMUSCULAR | Status: DC

## 2013-06-17 MED ORDER — BACITRACIN-POLYMYXIN B 500-10000 UNIT/GM OP OINT: 1.0000 "application " | TOPICAL_OINTMENT | Freq: Four times a day (QID) | OPHTHALMIC | Status: DC

## 2013-06-17 MED ORDER — MORPHINE SULFATE 2 MG/ML IJ SOLN: 1.0000 mg | INTRAMUSCULAR | Status: DC | PRN

## 2013-06-17 MED ORDER — TROPICAMIDE 1 % OP SOLN
1.0000 [drp] | OPHTHALMIC | Status: AC | PRN
  Administered 2013-06-17 (×3): 1 [drp] via OPHTHALMIC
  Filled 2013-06-17: qty 3

## 2013-06-17 MED ORDER — MAGNESIUM HYDROXIDE 400 MG/5ML PO SUSP: 15.0000 mL | Freq: Four times a day (QID) | ORAL | Status: DC | PRN

## 2013-06-17 MED ORDER — BRIMONIDINE TARTRATE 0.2 % OP SOLN
1.0000 [drp] | Freq: Two times a day (BID) | OPHTHALMIC | Status: DC
  Filled 2013-06-17: qty 5

## 2013-06-17 MED ORDER — GATIFLOXACIN 0.5 % OP SOLN
1.0000 [drp] | OPHTHALMIC | Status: AC | PRN
  Administered 2013-06-17 (×3): 1 [drp] via OPHTHALMIC
  Filled 2013-06-17: qty 2.5

## 2013-06-17 MED ORDER — BUPIVACAINE-EPINEPHRINE (PF) 0.25% -1:200000 IJ SOLN
INTRAMUSCULAR | Status: AC
  Filled 2013-06-17: qty 30

## 2013-06-17 MED ORDER — LATANOPROST 0.005 % OP SOLN: 1.0000 [drp] | Freq: Every day | OPHTHALMIC | Status: DC

## 2013-06-17 MED ORDER — BSS IO SOLN
INTRAOCULAR | Status: AC
  Filled 2013-06-17: qty 15

## 2013-06-17 MED ORDER — PHENYLEPHRINE HCL 10 MG/ML IJ SOLN
INTRAMUSCULAR | Status: DC | PRN
  Administered 2013-06-17 (×2): 40 ug via INTRAVENOUS

## 2013-06-17 MED ORDER — BSS PLUS IO SOLN
INTRAOCULAR | Status: AC
  Filled 2013-06-17: qty 500

## 2013-06-17 MED ORDER — TETRACAINE HCL 0.5 % OP SOLN
2.0000 [drp] | Freq: Once | OPHTHALMIC | Status: DC
  Filled 2013-06-17: qty 2

## 2013-06-17 MED ORDER — ROCURONIUM BROMIDE 100 MG/10ML IV SOLN
INTRAVENOUS | Status: DC | PRN
  Administered 2013-06-17: 50 mg via INTRAVENOUS
  Administered 2013-06-17 (×2): 10 mg via INTRAVENOUS

## 2013-06-17 MED ORDER — HEMOSTATIC AGENTS (NO CHARGE) OPTIME
TOPICAL | Status: DC | PRN
  Administered 2013-06-17: 1 via TOPICAL

## 2013-06-17 MED ORDER — MORPHINE SULFATE 2 MG/ML IJ SOLN
1.0000 mg | INTRAMUSCULAR | Status: AC | PRN
  Administered 2013-06-17 – 2013-06-18 (×2): 2 mg via INTRAVENOUS
  Filled 2013-06-17 (×2): qty 1

## 2013-06-17 MED ORDER — MIDAZOLAM HCL 5 MG/5ML IJ SOLN
INTRAMUSCULAR | Status: DC | PRN
  Administered 2013-06-17: 2 mg via INTRAVENOUS

## 2013-06-17 MED ORDER — ATROPINE SULFATE 1 % OP SOLN
OPHTHALMIC | Status: AC
  Filled 2013-06-17: qty 2

## 2013-06-17 MED ORDER — PREDNISOLONE ACETATE 1 % OP SUSP
1.0000 [drp] | Freq: Four times a day (QID) | OPHTHALMIC | Status: DC
  Filled 2013-06-17: qty 1

## 2013-06-17 MED ORDER — SODIUM CHLORIDE 0.45 % IV SOLN: INTRAVENOUS | Status: DC

## 2013-06-17 MED ORDER — BRIMONIDINE TARTRATE 0.2 % OP SOLN: 1.0000 [drp] | Freq: Two times a day (BID) | OPHTHALMIC | Status: DC

## 2013-06-17 MED ORDER — BUPIVACAINE HCL (PF) 0.75 % IJ SOLN
INTRAMUSCULAR | Status: DC | PRN
  Administered 2013-06-17: 10 mL

## 2013-06-17 MED ORDER — GATIFLOXACIN 0.5 % OP SOLN: 1.0000 [drp] | Freq: Four times a day (QID) | OPHTHALMIC | Status: DC

## 2013-06-17 MED ORDER — HYDROCODONE-ACETAMINOPHEN 5-325 MG PO TABS: 1.0000 | ORAL_TABLET | ORAL | Status: DC | PRN

## 2013-06-17 MED ORDER — GLYCOPYRROLATE 0.2 MG/ML IJ SOLN
INTRAMUSCULAR | Status: DC | PRN
  Administered 2013-06-17: 0.2 mg via INTRAVENOUS
  Administered 2013-06-17: 0.6 mg via INTRAVENOUS

## 2013-06-17 MED ORDER — SODIUM CHLORIDE 0.9 % IJ SOLN
INTRAMUSCULAR | Status: AC
  Filled 2013-06-17: qty 10

## 2013-06-17 MED ORDER — HYDROMORPHONE HCL PF 1 MG/ML IJ SOLN
0.2500 mg | INTRAMUSCULAR | Status: DC | PRN
  Administered 2013-06-17 (×2): 0.5 mg via INTRAVENOUS

## 2013-06-17 MED ORDER — TETRACAINE HCL 0.5 % OP SOLN: 2.0000 [drp] | Freq: Once | OPHTHALMIC | Status: DC

## 2013-06-17 MED ORDER — EPINEPHRINE HCL 1 MG/ML IJ SOLN
INTRAMUSCULAR | Status: AC
  Filled 2013-06-17: qty 1

## 2013-06-17 MED ORDER — BISOPROLOL FUMARATE 10 MG PO TABS
20.0000 mg | ORAL_TABLET | Freq: Every day | ORAL | Status: DC
  Filled 2013-06-17: qty 2

## 2013-06-17 MED ORDER — HYALURONIDASE HUMAN 150 UNIT/ML IJ SOLN
INTRAMUSCULAR | Status: AC
  Filled 2013-06-17: qty 1

## 2013-06-17 MED ORDER — PHENYLEPHRINE HCL 2.5 % OP SOLN
1.0000 [drp] | OPHTHALMIC | Status: AC | PRN
  Administered 2013-06-17 (×3): 1 [drp] via OPHTHALMIC
  Filled 2013-06-17: qty 15

## 2013-06-17 MED ORDER — BUPIVACAINE HCL (PF) 0.75 % IJ SOLN
INTRAMUSCULAR | Status: AC
  Filled 2013-06-17: qty 10

## 2013-06-17 MED ORDER — DOCUSATE SODIUM 100 MG PO CAPS: 100.0000 mg | ORAL_CAPSULE | Freq: Two times a day (BID) | ORAL | Status: DC

## 2013-06-17 MED ORDER — SODIUM HYALURONATE 10 MG/ML IO SOLN
INTRAOCULAR | Status: AC
  Filled 2013-06-17: qty 0.85

## 2013-06-17 MED ORDER — SODIUM CHLORIDE 0.9 % IV SOLN
INTRAVENOUS | Status: DC
  Administered 2013-06-17 (×4): via INTRAVENOUS

## 2013-06-17 MED ORDER — GENTAMICIN SULFATE 40 MG/ML IJ SOLN
INTRAMUSCULAR | Status: AC
  Filled 2013-06-17: qty 2

## 2013-06-17 MED ORDER — PREDNISOLONE ACETATE 1 % OP SUSP: 1.0000 [drp] | Freq: Four times a day (QID) | OPHTHALMIC | Status: DC

## 2013-06-17 MED ORDER — SODIUM CHLORIDE 0.9 % IJ SOLN
INTRAMUSCULAR | Status: DC | PRN
  Administered 2013-06-17: 16:00:00

## 2013-06-17 SURGICAL SUPPLY — 71 items
APL SRG 3 HI ABS STRL LF PLS (MISCELLANEOUS) ×6
APPLICATOR DR MATTHEWS STRL (MISCELLANEOUS) ×18 IMPLANT
BALL CTTN LRG ABS STRL LF (GAUZE/BANDAGES/DRESSINGS) ×3
BLADE EYE CATARACT 19 1.4 BEAV (BLADE) IMPLANT
BLADE MVR KNIFE 19G (BLADE) IMPLANT
BLADE SURG 15 STRL LF DISP TIS (BLADE) IMPLANT
BLADE SURG 15 STRL SS (BLADE)
CANNULA ANT CHAM MAIN (OPHTHALMIC RELATED) IMPLANT
CANNULA DUAL BORE 23G (CANNULA) IMPLANT
CORDS BIPOLAR (ELECTRODE) IMPLANT
COTTONBALL LRG STERILE PKG (GAUZE/BANDAGES/DRESSINGS) ×9 IMPLANT
COVER SURGICAL LIGHT HANDLE (MISCELLANEOUS) ×3 IMPLANT
DRAPE INCISE 51X51 W/FILM STRL (DRAPES) ×3 IMPLANT
DRAPE OPHTHALMIC 77X100 STRL (CUSTOM PROCEDURE TRAY) ×3 IMPLANT
ERASER HMR WETFIELD 23G BP (MISCELLANEOUS) IMPLANT
FILTER BLUE MILLIPORE (MISCELLANEOUS) IMPLANT
FILTER STRAW FLUID ASPIR (MISCELLANEOUS) IMPLANT
GAS AUTO FILL CONSTEL (OPHTHALMIC) ×3
GAS AUTO FILL CONSTELLATION (OPHTHALMIC) ×1 IMPLANT
GLOVE SS BIOGEL STRL SZ 6.5 (GLOVE) ×1 IMPLANT
GLOVE SS BIOGEL STRL SZ 7 (GLOVE) ×1 IMPLANT
GLOVE SUPERSENSE BIOGEL SZ 6.5 (GLOVE) ×2
GLOVE SUPERSENSE BIOGEL SZ 7 (GLOVE) ×2
GLOVE SURG 8.5 LATEX PF (GLOVE) ×3 IMPLANT
GOWN STRL NON-REIN LRG LVL3 (GOWN DISPOSABLE) ×6 IMPLANT
IMPL SILICONE (Ophthalmic Related) ×1 IMPLANT
IMPLANT SILICONE (Ophthalmic Related) ×6 IMPLANT
KIT BASIN OR (CUSTOM PROCEDURE TRAY) ×3 IMPLANT
KIT PERFLUORON PROCEDURE 5ML (MISCELLANEOUS) IMPLANT
KIT ROOM TURNOVER OR (KITS) ×3 IMPLANT
KNIFE CRESCENT 1.75 EDGEAHEAD (BLADE) IMPLANT
KNIFE GRIESHABER SHARP 2.5MM (MISCELLANEOUS) ×12 IMPLANT
MASK EYE SHIELD (GAUZE/BANDAGES/DRESSINGS) ×3 IMPLANT
NEEDLE 18GX1X1/2 (RX/OR ONLY) (NEEDLE) ×3 IMPLANT
NEEDLE 25GX 5/8IN NON SAFETY (NEEDLE) IMPLANT
NEEDLE 27GAX1X1/2 (NEEDLE) IMPLANT
NEEDLE HYPO 30X.5 LL (NEEDLE) ×6 IMPLANT
NS IRRIG 1000ML POUR BTL (IV SOLUTION) ×3 IMPLANT
PACK VITRECTOMY CUSTOM (CUSTOM PROCEDURE TRAY) ×3 IMPLANT
PAD ARMBOARD 7.5X6 YLW CONV (MISCELLANEOUS) ×6 IMPLANT
PAD EYE OVAL STERILE LF (GAUZE/BANDAGES/DRESSINGS) ×3 IMPLANT
PAK PIK VITRECTOMY CVS 25GA (OPHTHALMIC) IMPLANT
PROBE LASER ILLUM FLEX CVD 25G (OPHTHALMIC) IMPLANT
REPL STRA BRUSH NEEDLE (NEEDLE) IMPLANT
RESERVOIR BACK FLUSH (MISCELLANEOUS) IMPLANT
ROLLS DENTAL (MISCELLANEOUS) ×6 IMPLANT
SLEEVE SCLERAL TYPE 270 (Ophthalmic Related) ×3 IMPLANT
SPEAR EYE SURG WECK-CEL (MISCELLANEOUS) ×15 IMPLANT
SPONGE SURGIFOAM ABS GEL 12-7 (HEMOSTASIS) ×3 IMPLANT
STOPCOCK 4 WAY LG BORE MALE ST (IV SETS) IMPLANT
SUT CHROMIC 7 0 TG140 8 (SUTURE) ×3 IMPLANT
SUT ETHILON 9 0 TG140 8 (SUTURE) IMPLANT
SUT MERSILENE 4 0 RV 2 (SUTURE) ×9 IMPLANT
SUT SILK 2 0 (SUTURE) ×3
SUT SILK 2-0 18XBRD TIE 12 (SUTURE) ×1 IMPLANT
SUT SILK 4 0 RB 1 (SUTURE) ×3 IMPLANT
SUT VICRYL 7 0 TG140 8 (SUTURE) IMPLANT
SYR 20CC LL (SYRINGE) ×3 IMPLANT
SYR 50ML LL SCALE MARK (SYRINGE) IMPLANT
SYR 5ML LL (SYRINGE) ×3 IMPLANT
SYR BULB 3OZ (MISCELLANEOUS) ×3 IMPLANT
SYR TB 1ML LUER SLIP (SYRINGE) IMPLANT
SYRINGE 10CC LL (SYRINGE) ×3 IMPLANT
TAPE SURG TRANSPORE 1 IN (GAUZE/BANDAGES/DRESSINGS) ×1 IMPLANT
TAPE SURGICAL TRANSPORE 1 IN (GAUZE/BANDAGES/DRESSINGS) ×2
TIRE RTNL 2.5XGRV CNCV 9X (Ophthalmic Related) ×1 IMPLANT
TOWEL OR 17X24 6PK STRL BLUE (TOWEL DISPOSABLE) ×12 IMPLANT
TUBING ART PRESS 12 MALE/MALE (MISCELLANEOUS) IMPLANT
VITREORETINAL VISCODISSEC (MISCELLANEOUS) IMPLANT
WATER STERILE IRR 1000ML POUR (IV SOLUTION) ×3 IMPLANT
WIPE INSTRUMENT VISIWIPE 73X73 (MISCELLANEOUS) ×3 IMPLANT

## 2013-06-17 NOTE — Anesthesia Preprocedure Evaluation (Addendum)
Anesthesia Evaluation  Patient identified by MRN, date of birth, ID band Patient awake    Reviewed: Allergy & Precautions, H&P , NPO status , Patient's Chart, lab work & pertinent test results, reviewed documented beta blocker date and time   Airway Mallampati: III TM Distance: >3 FB Neck ROM: Full    Dental no notable dental hx. (+) Teeth Intact and Dental Advisory Given   Pulmonary sleep apnea and Continuous Positive Airway Pressure Ventilation ,  breath sounds clear to auscultation  Pulmonary exam normal       Cardiovascular hypertension, On Medications and On Home Beta Blockers Rhythm:Regular Rate:Normal     Neuro/Psych negative neurological ROS  negative psych ROS   GI/Hepatic negative GI ROS, Neg liver ROS,   Endo/Other  negative endocrine ROS  Renal/GU negative Renal ROS  negative genitourinary   Musculoskeletal   Abdominal   Peds  Hematology negative hematology ROS (+)   Anesthesia Other Findings   Reproductive/Obstetrics negative OB ROS                          Anesthesia Physical Anesthesia Plan  ASA: III  Anesthesia Plan: General   Post-op Pain Management:    Induction: Intravenous  Airway Management Planned: Oral ETT  Additional Equipment:   Intra-op Plan:   Post-operative Plan: Extubation in OR  Informed Consent: I have reviewed the patients History and Physical, chart, labs and discussed the procedure including the risks, benefits and alternatives for the proposed anesthesia with the patient or authorized representative who has indicated his/her understanding and acceptance.   Dental advisory given  Plan Discussed with: CRNA, Anesthesiologist and Surgeon  Anesthesia Plan Comments:        Anesthesia Quick Evaluation

## 2013-06-17 NOTE — Preoperative (Addendum)
Beta Blockers   Reason not to administer Beta Blockers:Not Applicable  Pt took bisoprolol 0600.

## 2013-06-17 NOTE — Transfer of Care (Signed)
Immediate Anesthesia Transfer of Care Note  Patient: Shane Lynch  Procedure(s) Performed: Procedure(s): SCLERAL BUCKLE WITH ENDOLASER (Left)  Patient Location: PACU  Anesthesia Type:General  Level of Consciousness: awake and alert   Airway & Oxygen Therapy: Patient Spontanous Breathing and Patient connected to face mask oxygen  Post-op Assessment: Report given to PACU RN and Post -op Vital signs reviewed and stable  Post vital signs: Reviewed and stable  Complications: No apparent anesthesia complications

## 2013-06-17 NOTE — H&P (Signed)
Shane Lynch is an 63 y.o. male.   Chief Complaint:loss of visual field left eye after cataract surgery HPI: rhegmatogenous retinal detachment left eye, primary  Past Medical History  Diagnosis Date  . Detached retina, bilateral   . Detached retina, right 1/12  . Hypertension   . Hyperlipemia   . Sleep apnea     uses cpap    Past Surgical History  Procedure Laterality Date  . Retinal detachment surgery  11/11,1/12    rt   . Inguinal hernia repair      lt  . Shoulder arthroscopy  2005    lt  . Eye surgery  2012    cataracts-bilar  . Strabismus surgery  06/06/2011    Procedure: REPAIR STRABISMUS;  Surgeon: Shara BlazingWilliam O Young;  Location: [facility];  Service: Ophthalmology;  Laterality: Right;  . Colonoscopy      History reviewed. No pertinent family history. Social History:  reports that he has never smoked. He has never used smokeless tobacco. He reports that he drinks alcohol. He reports that he does not use illicit drugs.  Allergies:  Allergies  Allergen Reactions  . Codeine Hives    No prescriptions prior to admission    Review of systems otherwise negative  There were no vitals taken for this visit.  Physical exam: Mental status: oriented x3. Eyes: See eye exam associated with this date of surgery in media tab.  Scanned in by scanning center Ears, Nose, Throat: within normal limits Neck: Within Normal limits General: within normal limits Chest: Within normal limits Breast: deferred Heart: Within normal limits Abdomen: Within normal limits GU: deferred Extremities: within normal limits Skin: within normal limits  Assessment/Plan Rhegmatogenous retinal detachment left eye [facility], laser treatment, possible pars plana vitrectomy left eye  Shane Lynch 06/17/2013, 7:24 AM

## 2013-06-17 NOTE — Anesthesia Postprocedure Evaluation (Signed)
  Anesthesia Post-op Note  Patient: Shane Lynch  Procedure(s) Performed: Procedure(s): SCLERAL BUCKLE WITH ENDOLASER (Left)  Patient Location: PACU  Anesthesia Type:General  Level of Consciousness: awake and alert   Airway and Oxygen Therapy: Patient Spontanous Breathing  Post-op Pain: mild  Post-op Assessment: Post-op Vital signs reviewed  Post-op Vital Signs: stable  Complications: No apparent anesthesia complications

## 2013-06-17 NOTE — Anesthesia Procedure Notes (Signed)
Procedure Name: Intubation Date/Time: 06/17/2013 4:55 PM Performed by: Brien MatesMAHONY, Anely Spiewak D Pre-anesthesia Checklist: Patient identified, Emergency Drugs available, Suction available, Timeout performed and Patient being monitored Patient Re-evaluated:Patient Re-evaluated prior to inductionOxygen Delivery Method: Circle system utilized Preoxygenation: Pre-oxygenation with 100% oxygen Intubation Type: IV induction Ventilation: Mask ventilation without difficulty Laryngoscope size: attempt x 1 with miller 2- grade 4 view. grade 1 view with Glidescope. Grade View: Grade I Tube type: Oral Tube size: 7.5 mm Number of attempts: 2 (DL attempt x 1 with miller 2 with no attempt to place ETT. DL x 1 with glidescope. Grade 1 view but ETT cuff with lost integrity.  Used blue stylet to place another ETT easily. + ETCO2 and = BBS. Teeth as preop.) Airway Equipment and Method: Stylet,  Video-laryngoscopy and Bougie stylet Placement Confirmation: ETT inserted through vocal cords under direct vision,  breath sounds checked- equal and bilateral and positive ETCO2 Secured at: 23 cm Tube secured with: Tape Dental Injury: Teeth and Oropharynx as per pre-operative assessment

## 2013-06-17 NOTE — Brief Op Note (Signed)
06/17/2013  7:22 PM  PATIENT:  Shane Lynch  63 y.o. male  PRE-OPERATIVE DIAGNOSIS:  RETINAL DETACHMENT LEFT EYE  POST-OPERATIVE DIAGNOSIS:  RETINAL DETACHMENT LEFT EYE  PROCEDURE:  Procedure(s): SCLERAL BUCKLE WITH ENDOLASER (Left)  SURGEON:  Surgeon(s) and Role:    * Sherrie GeorgeJohn D Laryssa Hassing, MD - Primary  Brief Operative note   Preoperative diagnosis:  RETINAL DETACHMENT LEFT EYE Postoperative diagnosis  * No Diagnosis Codes entered *  Procedures: Scleral buckle, laser and gas injection left eye  Surgeon:  Sherrie GeorgeJohn D Orlando Devereux, MD...  Assistant:  Rosalie DoctorLisa Johnson SA  Mindy Tichler  Anesthesia: General  Specimen: none  Estimated blood loss:  1cc  Complications: none  Patient sent to PACU in good condition  Composed by Sherrie GeorgeJohn D Nashton Belson MD  Dictation number: [id number]

## 2013-06-18 MED ORDER — BACITRACIN-POLYMYXIN B 500-10000 UNIT/GM OP OINT: 1.0000 "application " | TOPICAL_OINTMENT | Freq: Four times a day (QID) | OPHTHALMIC | Status: DC

## 2013-06-18 MED ORDER — GATIFLOXACIN 0.5 % OP SOLN: 1.0000 [drp] | Freq: Four times a day (QID) | OPHTHALMIC | Status: DC

## 2013-06-18 MED ORDER — HYDROCODONE-ACETAMINOPHEN 5-325 MG PO TABS: 1.0000 | ORAL_TABLET | ORAL | Status: DC | PRN

## 2013-06-18 MED ORDER — PREDNISOLONE ACETATE 1 % OP SUSP: 1.0000 [drp] | Freq: Four times a day (QID) | OPHTHALMIC | Status: DC

## 2013-06-18 NOTE — Discharge Summary (Signed)
Discharge summary not needed on [facility] patients per medical records. 

## 2013-06-18 NOTE — Op Note (Signed)
NAMCammy Copa:  Deyoung, Shane Lynch               ACCOUNT NO.:  [id number]  MEDICAL RECORD NO.:  [id number]  LOCATION:  6N05C                        FACILITY:  [facility]  PHYSICIAN:  [name]. Ashley RoyaltyMatthews, M.D. DATE OF BIRTH:  02-15-1951  DATE OF PROCEDURE:  06/17/2013 DATE OF DISCHARGE:  06/18/2013                              OPERATIVE REPORT   ADMISSION DIAGNOSIS:  Rhegmatogenous retinal detachment, left eye.  PROCEDURE:  Scleral buckle, left eye.  Gas injection, left eye.  Laser, left eye.  SURGEON:  [name]. Ashley RoyaltyMatthews, M.D.  ASSISTANT:  Rosalie DoctorLisa Johnson, SA and [facility] Hospital[facility]indy Titchler, CST.  ANESTHESIA:  General.  DETAILS:  After usual prep and drape, 360-degree limbal peritomy, isolation of 4 rectus muscles on 2-0 silk, scleral dissection for 360 degrees to admit a #279 intrascleral implant.  Diathermy placed in the bed.  279 implant was placed around the globe with a 240 band and 270 sleeve at 12 o'clock.  The buckle was jointed at 12 o'clock as well. Two sutures per quadrant for a total of 8 scleral sutures were placed in the scleral flaps.  The temporal sutures were placed with the knots posterior.  Perforation site was chosen at 5:30 o'clock in the posterior aspect of the bed.  A large amount of clear colorless subretinal fluid came forth in a controlled fashion.  Sterile room air was injected into the vitreous cavity to reinflate the globe as the fluid egressed. Multiple injections and fluid drains occurred until all fluid was removed.  The buckle was adjusted and trimmed.  The band was adjusted and trimmed.  Scleral sutures were drawn securely.  Indirect ophthalmoscopy showed the retina to be lying nicely on the scleral buckle.  The indirect ophthalmoscope laser was moved into place.  1200 burns were placed around the retinal periphery on the scleral buckle. The power was between 400 and 900 mW, 1000 microns each and 0.1 seconds each.  The buckle was trimmed.  The band was trimmed.  Scleral  sutures were knotted and the free ends removed.  The conjunctiva was reposited with 7-0 chromic suture.  Polymyxin and gentamicin were irrigated into tenon space.  Atropine solution was applied.  Closing pressure was 10 with a Risk managerBaer keratometer.  Marcaine was injected around the globe for postop pain.  Polysporin ophthalmic ointment, a patch, and shield were placed.  The patient was awakened, taken to recovery in satisfactory condition.  Operative time 2-1/2 hours.  COMPLICATIONS:  None.     [name]. Ashley RoyaltyMatthews, M.D.    JDM/MEDQ  D:  06/17/2013  T:  06/18/2013  Job:  [id number]

## 2013-06-18 NOTE — Progress Notes (Signed)
06/18/2013, 8:41 AM  Mental Status:  Awake, Alert, Oriented  Anterior segment: Cornea  Clear    Anterior Chamber Clear    Lens:    IOL,   Intra Ocular Pressure 22 mmHg with Tonopen  Vitreous: Clear 45%gas bubble   Retina:  Attached Good laser reaction   Impression: Excellent result Retina attached   Final Diagnosis: Active Problems:   Rhegmatogenous retinal detachment of left eye   Plan: start post operative eye drops.  Discharge to home.  Give post operative instructions  Sherrie GeorgeMATTHEWS, Ervin Rothbauer D 06/18/2013, 8:41 AM

## 2013-06-20 ENCOUNTER — Encounter (HOSPITAL_COMMUNITY): Payer: Self-pay | Admitting: Ophthalmology

## 2013-06-24 ENCOUNTER — Inpatient Hospital Stay (INDEPENDENT_AMBULATORY_CARE_PROVIDER_SITE_OTHER): Payer: BC Managed Care – PPO | Admitting: Ophthalmology

## 2013-06-24 DIAGNOSIS — H33009 Unspecified retinal detachment with retinal break, unspecified eye: Secondary | ICD-10-CM

## 2013-07-14 ENCOUNTER — Ambulatory Visit (INDEPENDENT_AMBULATORY_CARE_PROVIDER_SITE_OTHER): Payer: BC Managed Care – PPO | Admitting: Ophthalmology

## 2013-07-15 ENCOUNTER — Encounter (INDEPENDENT_AMBULATORY_CARE_PROVIDER_SITE_OTHER): Payer: BC Managed Care – PPO | Admitting: Ophthalmology

## 2013-07-15 DIAGNOSIS — H33009 Unspecified retinal detachment with retinal break, unspecified eye: Secondary | ICD-10-CM

## 2013-09-23 ENCOUNTER — Encounter (INDEPENDENT_AMBULATORY_CARE_PROVIDER_SITE_OTHER): Payer: BC Managed Care – PPO | Admitting: Ophthalmology

## 2013-09-23 DIAGNOSIS — H33009 Unspecified retinal detachment with retinal break, unspecified eye: Secondary | ICD-10-CM

## 2013-09-23 DIAGNOSIS — H43819 Vitreous degeneration, unspecified eye: Secondary | ICD-10-CM

## 2013-09-23 DIAGNOSIS — I10 Essential (primary) hypertension: Secondary | ICD-10-CM

## 2013-09-23 DIAGNOSIS — H35039 Hypertensive retinopathy, unspecified eye: Secondary | ICD-10-CM

## 2014-03-28 ENCOUNTER — Ambulatory Visit (INDEPENDENT_AMBULATORY_CARE_PROVIDER_SITE_OTHER): Payer: BC Managed Care – PPO | Admitting: Ophthalmology

## 2014-03-28 DIAGNOSIS — H43812 Vitreous degeneration, left eye: Secondary | ICD-10-CM

## 2014-03-28 DIAGNOSIS — H35033 Hypertensive retinopathy, bilateral: Secondary | ICD-10-CM

## 2014-03-28 DIAGNOSIS — H338 Other retinal detachments: Secondary | ICD-10-CM

## 2014-03-28 DIAGNOSIS — I10 Essential (primary) hypertension: Secondary | ICD-10-CM

## 2015-03-29 ENCOUNTER — Ambulatory Visit (INDEPENDENT_AMBULATORY_CARE_PROVIDER_SITE_OTHER): Payer: BLUE CROSS/BLUE SHIELD | Admitting: Ophthalmology

## 2015-03-29 DIAGNOSIS — H43813 Vitreous degeneration, bilateral: Secondary | ICD-10-CM | POA: Diagnosis not present

## 2015-03-29 DIAGNOSIS — H338 Other retinal detachments: Secondary | ICD-10-CM | POA: Diagnosis not present

## 2016-04-01 ENCOUNTER — Ambulatory Visit (INDEPENDENT_AMBULATORY_CARE_PROVIDER_SITE_OTHER): Payer: BLUE CROSS/BLUE SHIELD | Admitting: Ophthalmology

## 2016-08-15 DIAGNOSIS — D649 Anemia, unspecified: Secondary | ICD-10-CM | POA: Diagnosis not present

## 2016-08-25 DIAGNOSIS — D649 Anemia, unspecified: Secondary | ICD-10-CM | POA: Diagnosis not present

## 2016-08-25 DIAGNOSIS — D510 Vitamin B12 deficiency anemia due to intrinsic factor deficiency: Secondary | ICD-10-CM | POA: Diagnosis not present

## 2016-09-17 DIAGNOSIS — E785 Hyperlipidemia, unspecified: Secondary | ICD-10-CM | POA: Diagnosis not present

## 2016-09-17 DIAGNOSIS — E559 Vitamin D deficiency, unspecified: Secondary | ICD-10-CM | POA: Diagnosis not present

## 2016-09-17 DIAGNOSIS — D519 Vitamin B12 deficiency anemia, unspecified: Secondary | ICD-10-CM | POA: Diagnosis not present

## 2016-09-17 DIAGNOSIS — D649 Anemia, unspecified: Secondary | ICD-10-CM | POA: Diagnosis not present

## 2016-09-17 DIAGNOSIS — Z0000 Encounter for general adult medical examination without abnormal findings: Secondary | ICD-10-CM | POA: Diagnosis not present

## 2016-09-17 DIAGNOSIS — R946 Abnormal results of thyroid function studies: Secondary | ICD-10-CM | POA: Diagnosis not present

## 2016-09-17 DIAGNOSIS — D508 Other iron deficiency anemias: Secondary | ICD-10-CM | POA: Diagnosis not present

## 2016-09-17 DIAGNOSIS — R42 Dizziness and giddiness: Secondary | ICD-10-CM | POA: Diagnosis not present

## 2016-09-17 DIAGNOSIS — Z23 Encounter for immunization: Secondary | ICD-10-CM | POA: Diagnosis not present

## 2016-09-17 DIAGNOSIS — I10 Essential (primary) hypertension: Secondary | ICD-10-CM | POA: Diagnosis not present

## 2016-09-17 LAB — VITAMIN B12: VITAMIN B 12: 229

## 2016-09-17 LAB — BASIC METABOLIC PANEL
CREATININE: 0.9 (ref 0.6–1.3)
GLUCOSE: 125
SODIUM: 141 (ref 137–147)

## 2016-09-17 LAB — CBC AND DIFFERENTIAL: WBC: 4.6

## 2016-09-17 LAB — VITAMIN D 25 HYDROXY (VIT D DEFICIENCY, FRACTURES): VIT D 25 HYDROXY: 25

## 2016-09-18 DIAGNOSIS — Z0000 Encounter for general adult medical examination without abnormal findings: Secondary | ICD-10-CM | POA: Diagnosis not present

## 2016-09-18 DIAGNOSIS — R42 Dizziness and giddiness: Secondary | ICD-10-CM | POA: Diagnosis not present

## 2016-10-24 ENCOUNTER — Encounter: Payer: Self-pay | Admitting: Family Medicine

## 2016-12-09 ENCOUNTER — Telehealth: Payer: Self-pay | Admitting: Internal Medicine

## 2016-12-09 NOTE — Telephone Encounter (Signed)
Records have been placed on 12/09/16 AM Doctor of the Dr.Perry's desk for review.

## 2016-12-15 ENCOUNTER — Encounter: Payer: Self-pay | Admitting: Internal Medicine

## 2016-12-15 NOTE — Telephone Encounter (Signed)
Dr. Marina GoodellPerry reviewed records and accepted patient.  LM on Vmail to CB to schedule an OV.

## 2016-12-18 DIAGNOSIS — R208 Other disturbances of skin sensation: Secondary | ICD-10-CM | POA: Diagnosis not present

## 2016-12-18 DIAGNOSIS — M549 Dorsalgia, unspecified: Secondary | ICD-10-CM | POA: Diagnosis not present

## 2016-12-18 DIAGNOSIS — R110 Nausea: Secondary | ICD-10-CM | POA: Diagnosis not present

## 2016-12-18 DIAGNOSIS — D508 Other iron deficiency anemias: Secondary | ICD-10-CM | POA: Diagnosis not present

## 2016-12-22 DIAGNOSIS — M47816 Spondylosis without myelopathy or radiculopathy, lumbar region: Secondary | ICD-10-CM | POA: Diagnosis not present

## 2016-12-22 DIAGNOSIS — R208 Other disturbances of skin sensation: Secondary | ICD-10-CM | POA: Diagnosis not present

## 2017-02-02 ENCOUNTER — Other Ambulatory Visit (INDEPENDENT_AMBULATORY_CARE_PROVIDER_SITE_OTHER): Payer: Medicare Other

## 2017-02-02 ENCOUNTER — Encounter (INDEPENDENT_AMBULATORY_CARE_PROVIDER_SITE_OTHER): Payer: Self-pay

## 2017-02-02 ENCOUNTER — Ambulatory Visit (INDEPENDENT_AMBULATORY_CARE_PROVIDER_SITE_OTHER): Payer: Medicare Other | Admitting: Internal Medicine

## 2017-02-02 ENCOUNTER — Other Ambulatory Visit: Payer: Self-pay

## 2017-02-02 ENCOUNTER — Encounter: Payer: Self-pay | Admitting: Internal Medicine

## 2017-02-02 VITALS — BP 118/66 | HR 68 | Ht 68.0 in | Wt 239.0 lb

## 2017-02-02 DIAGNOSIS — K644 Residual hemorrhoidal skin tags: Secondary | ICD-10-CM | POA: Diagnosis not present

## 2017-02-02 DIAGNOSIS — K648 Other hemorrhoids: Secondary | ICD-10-CM

## 2017-02-02 DIAGNOSIS — D509 Iron deficiency anemia, unspecified: Secondary | ICD-10-CM

## 2017-02-02 LAB — CBC WITH DIFFERENTIAL/PLATELET
Basophils Absolute: 0.1 10*3/uL (ref 0.0–0.1)
Basophils Relative: 1.1 % (ref 0.0–3.0)
Eosinophils Absolute: 0.1 10*3/uL (ref 0.0–0.7)
Eosinophils Relative: 2.6 % (ref 0.0–5.0)
HCT: 35.7 % — ABNORMAL LOW (ref 39.0–52.0)
Hemoglobin: 11.9 g/dL — ABNORMAL LOW (ref 13.0–17.0)
LYMPHS ABS: 1.5 10*3/uL (ref 0.7–4.0)
Lymphocytes Relative: 27.4 % (ref 12.0–46.0)
MCHC: 33.4 g/dL (ref 30.0–36.0)
MCV: 97.2 fl (ref 78.0–100.0)
Monocytes Absolute: 0.6 10*3/uL (ref 0.1–1.0)
Monocytes Relative: 10.4 % (ref 3.0–12.0)
NEUTROS ABS: 3.2 10*3/uL (ref 1.4–7.7)
NEUTROS PCT: 58.5 % (ref 43.0–77.0)
PLATELETS: 266 10*3/uL (ref 150.0–400.0)
RBC: 3.68 Mil/uL — ABNORMAL LOW (ref 4.22–5.81)
RDW: 14.7 % (ref 11.5–15.5)
WBC: 5.5 10*3/uL (ref 4.0–10.5)

## 2017-02-02 LAB — FERRITIN: Ferritin: 18.5 ng/mL — ABNORMAL LOW (ref 22.0–322.0)

## 2017-02-02 LAB — IGA: IGA: 443 mg/dL — AB (ref 68–378)

## 2017-02-02 NOTE — Patient Instructions (Addendum)
Your physician has requested that you go to the basement for lab work before leaving today  Stay on your iron  See instructions for Capsule Endoscopy

## 2017-02-02 NOTE — Progress Notes (Signed)
HISTORY OF PRESENT ILLNESS:  Shane Lynch is a 66 y.o. male with hypertension, hyperlipidemia, GERD, family history of colon cancer in his father, and recurrent iron deficiency anemia. He presents today with a myriad of GI and non-GI complaints or issues. Principal issue is requesting a second opinion for recurrent iron deficiency anemia and dissatisfaction with his previous examination. Other issues include bleeding hemorrhoids, and left breast tenderness. I have reviewed 47 pages of outside records from his previous gastroenterologist Dr. Danton Lynch. The patient was found to have iron deficiency anemia with a hemoglobin of 9 2015. Hemoccult testing was negative. He underwent colonoscopy and upper endoscopy in June 2015. Colonoscopy revealed mild pandiverticulosis and external hemorrhoids. Upper endoscopy revealed an irregular GE junction. Biopsies did not refill Barrett's. Small bowel biopsies were normal. Previous colonoscopy in 2011 revealed pandiverticular disease and external hemorrhoids. He was placed on iron therapy. His hemoglobin levels recovered. Hemoglobin in June 2017 was 14.6. Iron studies normal. Testing for celiac disease negative. Repeat upper endoscopy and colonoscopy for recurrent anemia were performed in November 2016. Upper endoscopy revealed a small hiatal hernia. Biopsies of the duodenum revealed some nonspecific blunting. Colonoscopy again revealed diverticulosis and external hemorrhoids. Patient has been fairly compliant with his iron. Generally once per day. Recently ran out. Tells me he has not had blood work in some time. He does have intermittent problems with significant rectal bleeding. This is confirmed by his wife. He is interested in banding. He has not donated blood in over 10 years. No bleeding in other areas such as urine or nose. He has not had a hematology evaluation. He has not had a capsule endoscopy. No haptoglobin level. He eats a regular diet and does not  avoid gluten. Finally, he did undergo CT scan of the abdomen and pelvis 11/18/2013 which revealed fatty infiltration of the liver, concentric focal thickening of the right colon just above the ileocecal valve. Questionable focal diverticulitis and mass lesion could not be excluded. Right inguinal hernia with fat. Small lymph nodes nonpathologic.  REVIEW OF SYSTEMS:  All non-GI ROS negative unless otherwise stated in the history of present illness except for sinus allergy, breast changes, cough, fatigue, headaches, ankle swelling, sleeping problems  Past Medical History:  Diagnosis Date  . Colon polyps   . Detached retina, bilateral   . Detached retina, right 1/12  . Diverticulosis   . GERD (gastroesophageal reflux disease)   . Hemorrhoids   . Hiatal hernia   . Hyperlipemia   . Hypertension   . IDA (iron deficiency anemia)   . Sleep apnea    uses cpap, sleep study- 2005    Past Surgical History:  Procedure Laterality Date  . COLONOSCOPY    . EYE SURGERY  2012   cataracts-bilar  . INGUINAL HERNIA REPAIR     lt  . RETINAL DETACHMENT SURGERY  11/11,1/12   rt   . SCLERAL BUCKLE Left 06/17/2013   Procedure: SCLERAL BUCKLE WITH ENDOLASER;  Surgeon: Hayden Pedro, MD;  Location: [facility];  Service: Ophthalmology;  Laterality: Left;  . SHOULDER ARTHROSCOPY  2005   lt  . STRABISMUS SURGERY  06/06/2011   Procedure: REPAIR STRABISMUS;  Surgeon: Derry Skill;  Location: [place] [facility];  Service: Ophthalmology;  Laterality: Right;    Social History Shane Lynch  reports that he has never smoked. He has never used smokeless tobacco. He reports that he drinks alcohol. He reports that he does not use drugs.  family history  includes Brain cancer in his mother; Colon cancer in his father; Colon polyps in his father; Liver cancer in his father; Lung cancer in his father and mother; Pancreatic cancer in his father; Ulcerative colitis in his father.  Allergies  Allergen  Reactions  . Codeine Hives       PHYSICAL EXAMINATION: Vital signs: BP 118/66   Pulse 68   Ht '5\' 8"'$  (1.727 m)   Wt 239 lb (108.4 kg)   BMI 36.34 kg/m   Constitutional: generally well-appearing, no acute distress Psychiatric: alert and oriented x3, cooperative Eyes: extraocular movements intact, anicteric, conjunctiva pink Mouth: oral pharynx moist, no lesions. No angiodysplastic lesions Neck: supple without thyromegaly Lymph: no lymphadenopathy Breasts: Right breast normal. Left breast with fullness or mass and tenderness. No induration Cardiovascular: heart regular rate and rhythm, no murmur Lungs: clear to auscultation bilaterally Abdomen: soft, obese, nontender, nondistended, no obvious ascites, no peritoneal signs, normal bowel sounds, no organomegaly Rectal: External hemorrhoid tags. Grade 3 internal hemorrhoids. Extremities: no clubbing or cyanosis. Trace lower extremity edema bilaterally Skin: no lesions on visible extremities Neuro: No focal deficits.  Cranial nerves intact.   ASSESSMENT:  #1. Recurrent iron deficiency anemia with negative standard endoscopic evaluations. Rule out small bowel lesion. Rule out chronic hemorrhoidal bleeding #2. Family history of colon cancer. Multiple colonoscopies as outlined #3. Bleeding hemorrhoids #4. Tender left breast mass. Needs evaluated. Rule out neoplasia #5. Gen. medical problems   PLAN:  #1. CBC, ferritin, haptoglobin, celiac panel today #2. Continue iron once daily. May need to adjust based on blood work results from today #3. Capsule endoscopy to rule out small bowel lesion. #4. Hemorrhoidal banding. My partner, Dr. Carlean Purl, was kind enough to come into the examination room and also assess this patient's hemorrhoids. He was also kind enough to arrange for hemorrhoidal banding later today. The patient is quite pleased. #5. Routine screening colonoscopy for family history of colon cancer around November 2021. Recall made  RN #6. Routine GI office follow-up in 4-6 weeks #7. Told to see his PCP ASAP regarding tender left breast mass. He understands the importance and the differential diagnosis as discussed. As does his wife. They agree   One hour spent face-to-face with the patient. 50% of the time use for counseling and coordination of care regarding his recurrent iron deficiency anemia, hemorrhoids, and breast mass.

## 2017-02-02 NOTE — Progress Notes (Signed)
  HEMORRHOID LIGATION  Symptoms are bleeding prolapse anal irritation fecal seepage. The patient reports prolonged bleeding from hemorrhoids, with protrusion of all 3 piles at times and prolapse. He manually reduces at times. He reports regular daily defecation without significant straining. Sometimes he has to spend prolonged periods of time in the bathroom due to persistent bleeding which drips or streams. He wears a pad because of leakage of blood and fecal seepage and mucus in between stools.  The patient has iron deficiency anemia that is recurrent and could be related to bleeding hemorrhoids  He was seen today by Dr. Marina Goodell and I was asked to evaluate him.  Rectal exam reveals moderately large fleshy anal tags in all positions, in the left lateral to posterior area there is a small reducible hemorrhoid. Grade 3 prolapse. Slightly tender to touch. No rectal mass.  Anoscopy is performed and demonstrates grade 2 (at least) internal hemorrhoids with inflammation in all 3 positions. The prolapsing hemorrhoid appears to be an external component on the left lateral to posterior aspect.  PROCEDURE NOTE: The patient presents with symptomatic grade 2-3 hemorrhoids, requesting rubber band ligation of his/her hemorrhoidal disease.  All risks, benefits and alternative forms of therapy were described and informed consent was obtained.   The anorectum was pre-medicated with 0.125% nitroglycerin and 5% lidocaine The decision was made to band the left lateral and right posterior internal hemorrhoids, and the [facility] O'Regan System was used to perform band ligation without complication.  Digital anorectal examination was then performed to assure proper positioning of the band, and to adjust the banded tissue as required.  The patient was discharged home without pain or other issues.  Dietary and behavioral recommendations were given and along with follow-up instructions.     The following adjunctive  treatments were recommended: He will continue his fiber supplementation.  The patient will return in 2-3 weeks for  follow-up and possible additional banding as required. No complications were encountered and the patient tolerated the procedure well.  Hopefully a small external component will reduce in size with banding of the internal components. That can happen. I have advised him that his situation is somewhat complicated and given the chronicity he may require more than one banding of each hemorrhoidal pile. Should this fail to work surgical evaluation would be a reasonable option.  I appreciate the opportunity to care for this patient. CC: Irena Reichmann, DO Dr. Yancey Flemings

## 2017-02-02 NOTE — Patient Instructions (Addendum)
  HEMORRHOID BANDING PROCEDURE    FOLLOW-UP CARE   1. The procedure you have had should have been relatively painless since the banding of the area involved does not have nerve endings and there is no pain sensation.  The rubber band cuts off the blood supply to the hemorrhoid and the band may fall off as soon as 48 hours after the banding (the band may occasionally be seen in the toilet bowl following a bowel movement). You may notice a temporary feeling of fullness in the rectum which should respond adequately to plain Tylenol or Motrin.  2. Following the banding, avoid strenuous exercise that evening and resume full activity the next day.  A sitz bath (soaking in a warm tub) or bidet is soothing, and can be useful for cleansing the area after bowel movements.     3. To avoid constipation, take two tablespoons of natural wheat bran, natural oat bran, flax, Benefiber or any over the counter fiber supplement and increase your water intake to 7-8 glasses daily.    4. Unless you have been prescribed anorectal medication, do not put anything inside your rectum for two weeks: No suppositories, enemas, fingers, etc.  5. Occasionally, you may have more bleeding than usual after the banding procedure.  This is often from the untreated hemorrhoids rather than the treated one.  Don't be concerned if there is a tablespoon or so of blood.  If there is more blood than this, lie flat with your bottom higher than your head and apply an ice pack to the area. If the bleeding does not stop within a half an hour or if you feel faint, call our office at [phone] or go to the emergency room.  6. Problems are not common; however, if there is a substantial amount of bleeding, severe pain, chills, fever or difficulty passing urine (very rare) or other problems, you should call us at [phone] or report to the nearest emergency room.  7. Do not stay seated continuously for more than 2-3 hours for a day or  two after the procedure.  Tighten your buttock muscles 10-15 times every two hours and take 10-15 deep breaths every 1-2 hours.  Do not spend more than a few minutes on the toilet if you cannot empty your bowel; instead re-visit the toilet at a later time.   We will see you back for more banding on 02/18/17 at 9:15AM.    I appreciate the opportunity to care for you. Stan Head, MD, [facility]

## 2017-02-03 LAB — HAPTOGLOBIN: Haptoglobin: 208 mg/dL (ref 43–212)

## 2017-02-04 ENCOUNTER — Telehealth: Payer: Self-pay

## 2017-02-04 LAB — TISSUE TRANSGLUTAMINASE, IGA: Tissue Transglutaminase Ab, IgA: 1 U/mL (ref <4)

## 2017-02-04 NOTE — Telephone Encounter (Signed)
Told patient that per Dr. Marina GoodellPerry his next colonoscopy would not be due until 04/2020.  Patient acknowledged and understood.

## 2017-02-04 NOTE — Telephone Encounter (Signed)
Lm on vm 

## 2017-02-11 ENCOUNTER — Ambulatory Visit (INDEPENDENT_AMBULATORY_CARE_PROVIDER_SITE_OTHER): Payer: Medicare Other | Admitting: Internal Medicine

## 2017-02-11 ENCOUNTER — Encounter: Payer: Self-pay | Admitting: Internal Medicine

## 2017-02-11 DIAGNOSIS — D508 Other iron deficiency anemias: Secondary | ICD-10-CM

## 2017-02-11 NOTE — Progress Notes (Signed)
Late entry   Patient here for capsule endoscopy and took capsule at 8:10 am. Tolerated procedure. Verbalizes understanding of written and verbal instructions.Capsule ID [id number] lot # [id number] exp 04/14/2018

## 2017-02-17 ENCOUNTER — Telehealth: Payer: Self-pay | Admitting: Internal Medicine

## 2017-02-17 DIAGNOSIS — T189XXA Foreign body of alimentary tract, part unspecified, initial encounter: Secondary | ICD-10-CM

## 2017-02-17 NOTE — Telephone Encounter (Signed)
Patient not sure that he has passed the capsule endoscopy .  He is advised that he can come for a KUB at his convenience

## 2017-02-18 ENCOUNTER — Encounter: Payer: Self-pay | Admitting: Internal Medicine

## 2017-02-18 ENCOUNTER — Ambulatory Visit (INDEPENDENT_AMBULATORY_CARE_PROVIDER_SITE_OTHER): Payer: Medicare Other | Admitting: Internal Medicine

## 2017-02-18 DIAGNOSIS — K644 Residual hemorrhoidal skin tags: Secondary | ICD-10-CM | POA: Diagnosis not present

## 2017-02-18 DIAGNOSIS — K648 Other hemorrhoids: Secondary | ICD-10-CM

## 2017-02-18 HISTORY — DX: Other hemorrhoids: K64.8

## 2017-02-18 NOTE — Progress Notes (Signed)
    HEMORRHOID LIGATION  Sxs: Bleeding and prolapse and fecal smearing much improved after banding of right posterior and left lateral internal components. Recent capsule endoscopy some diarrhea he can feel prolapse again. Slight bleeding lately. Difficulty cleansing with chronic thickened anal skin tags also. Much better overall.  Rectal exam shows a prolapsed right posterior external hemorrhoid thickened and indurated and erythematous with slight heme. There are large anal fibrotic tags in all 3 positions. Mildly tender diffusely.  PROCEDURE NOTE: The patient presents with symptomatic grade 2-3  hemorrhoids, requesting rubber band ligation of his/her hemorrhoidal disease.  All risks, benefits and alternative forms of therapy were described and informed consent was obtained.   The anorectum was pre-medicated with 0.125% nitroglycerin and 5% lidocaine The decision was made to band the right anterior internal hemorrhoid, and the [facility] O'Regan System was used to perform band ligation without complication.  Digital anorectal examination was then performed to assure proper positioning of the band, and to adjust the banded tissue as required.  The patient was discharged home without pain or other issues.  Dietary and behavioral recommendations were given and along with follow-up instructions.     The following adjunctive treatments were recommended:  Balneol as needed and directly care or generic equivalent as needed. He has been using Proctosol for perianal irritation occasionally and I have suggested these instead.  This external hemorrhoid on the right posterior aspect has not responded to banding though there could be some improvement still I think surgical removal make sense. Hopefully this could be done with local and not require anesthesia etc. He has some interest in removal of the skin tags depending upon the morbidity and recovery of that. He will be referred to colorectal surgery.  The  patient will return here as needed at this time pending surgical evaluation and treatment,  for follow-up and possible additional banding as required. No complications were encountered and the patient tolerated the procedure well.  I appreciate the opportunity to care for this patient. CC: Irena Reichmannollins, Dana, DO Dr. Yancey FlemingsJohn Perry

## 2017-02-18 NOTE — Patient Instructions (Addendum)
HEMORRHOID BANDING PROCEDURE    FOLLOW-UP CARE   1. The procedure you have had should have been relatively painless since the banding of the area involved does not have nerve endings and there is no pain sensation.  The rubber band cuts off the blood supply to the hemorrhoid and the band may fall off as soon as 48 hours after the banding (the band may occasionally be seen in the toilet bowl following a bowel movement). You may notice a temporary feeling of fullness in the rectum which should respond adequately to plain Tylenol or Motrin.  2. Following the banding, avoid strenuous exercise that evening and resume full activity the next day.  A sitz bath (soaking in a warm tub) or bidet is soothing, and can be useful for cleansing the area after bowel movements.     3. To avoid constipation, take two tablespoons of natural wheat bran, natural oat bran, flax, Benefiber or any over the counter fiber supplement and increase your water intake to 7-8 glasses daily.    4. Unless you have been prescribed anorectal medication, do not put anything inside your rectum for two weeks: No suppositories, enemas, fingers, etc.  5. Occasionally, you may have more bleeding than usual after the banding procedure.  This is often from the untreated hemorrhoids rather than the treated one.  Don't be concerned if there is a tablespoon or so of blood.  If there is more blood than this, lie flat with your bottom higher than your head and apply an ice pack to the area. If the bleeding does not stop within a half an hour or if you feel faint, call our office at [phone] or go to the emergency room.  6. Problems are not common; however, if there is a substantial amount of bleeding, severe pain, chills, fever or difficulty passing urine (very rare) or other problems, you should call us at [phone] or report to the nearest emergency room.  7. Do not stay seated continuously for more than 2-3 hours for a day or two  after the procedure.  Tighten your buttock muscles 10-15 times every two hours and take 10-15 deep breaths every 1-2 hours.  Do not spend more than a few minutes on the toilet if you cannot empty your bowel; instead re-visit the toilet at a later time.    We are going to refer you to [facility], they will contact you with the date and time of your appointment.  They are at : [address], [address], [place] [address], phone # [phone].   I appreciate the opportunity to care for you. Stan Headarl Gessner, MD, [facility]

## 2017-02-18 NOTE — Addendum Note (Signed)
Addended by: Swaz[place]ORDAN, Jowan Skillin E on: 02/18/2017 01:04 PM   Modules accepted: Orders

## 2017-02-26 DIAGNOSIS — K649 Unspecified hemorrhoids: Secondary | ICD-10-CM | POA: Diagnosis not present

## 2017-03-05 DIAGNOSIS — Z23 Encounter for immunization: Secondary | ICD-10-CM | POA: Diagnosis not present

## 2017-03-11 ENCOUNTER — Telehealth: Payer: Self-pay | Admitting: Internal Medicine

## 2017-03-11 DIAGNOSIS — T189XXA Foreign body of alimentary tract, part unspecified, initial encounter: Secondary | ICD-10-CM

## 2017-03-11 NOTE — Telephone Encounter (Signed)
Pt states he has been having stomach pain for the past 3 days, states he is also having a lot of gurgling in his intestines. He has not had any fevers. Pt states he has not seen the capsule in his stool and wonders if he passed it. Pt will come for xray tomorrow to see if capsule has passed. Order in epic.

## 2017-03-12 ENCOUNTER — Ambulatory Visit (INDEPENDENT_AMBULATORY_CARE_PROVIDER_SITE_OTHER)
Admission: RE | Admit: 2017-03-12 | Discharge: 2017-03-12 | Disposition: A | Discharge status: Home / Self Care | Payer: Medicare Other | Source: Ambulatory Visit | Attending: Internal Medicine | Admitting: Internal Medicine

## 2017-03-12 ENCOUNTER — Other Ambulatory Visit (INDEPENDENT_AMBULATORY_CARE_PROVIDER_SITE_OTHER): Payer: Medicare Other

## 2017-03-12 ENCOUNTER — Other Ambulatory Visit: Payer: Self-pay

## 2017-03-12 DIAGNOSIS — R1030 Lower abdominal pain, unspecified: Secondary | ICD-10-CM | POA: Diagnosis not present

## 2017-03-12 DIAGNOSIS — D509 Iron deficiency anemia, unspecified: Secondary | ICD-10-CM

## 2017-03-12 DIAGNOSIS — T189XXA Foreign body of alimentary tract, part unspecified, initial encounter: Secondary | ICD-10-CM | POA: Diagnosis not present

## 2017-03-12 LAB — CBC WITH DIFFERENTIAL/PLATELET
BASOS ABS: 0.1 10*3/uL (ref 0.0–0.1)
Basophils Relative: 1.5 % (ref 0.0–3.0)
EOS ABS: 0.1 10*3/uL (ref 0.0–0.7)
Eosinophils Relative: 1.1 % (ref 0.0–5.0)
HCT: 40.3 % (ref 39.0–52.0)
Hemoglobin: 13.4 g/dL (ref 13.0–17.0)
LYMPHS ABS: 2 10*3/uL (ref 0.7–4.0)
Lymphocytes Relative: 30.8 % (ref 12.0–46.0)
MCHC: 33.2 g/dL (ref 30.0–36.0)
MCV: 89.2 fl (ref 78.0–100.0)
MONO ABS: 0.7 10*3/uL (ref 0.1–1.0)
Monocytes Relative: 10.4 % (ref 3.0–12.0)
NEUTROS ABS: 3.7 10*3/uL (ref 1.4–7.7)
Neutrophils Relative %: 56.2 % (ref 43.0–77.0)
PLATELETS: 263 10*3/uL (ref 150.0–400.0)
RBC: 4.52 Mil/uL (ref 4.22–5.81)
RDW: 16.1 % — ABNORMAL HIGH (ref 11.5–15.5)
WBC: 6.6 10*3/uL (ref 4.0–10.5)

## 2017-03-12 LAB — FERRITIN: FERRITIN: 12.8 ng/mL — AB (ref 22.0–322.0)

## 2017-03-13 ENCOUNTER — Ambulatory Visit: Payer: Self-pay | Admitting: Surgery

## 2017-03-16 ENCOUNTER — Encounter (HOSPITAL_BASED_OUTPATIENT_CLINIC_OR_DEPARTMENT_OTHER): Payer: Self-pay | Admitting: *Deleted

## 2017-03-16 NOTE — Progress Notes (Signed)
NPO AFTER MN.  ARRIVE AT 0800.  NEEDS CMET AND EKG.   CURRENT CBCdiff IN CHART AND EPIC. WILL TAKE BIOPROLOL AND PRILOSEC AM DOS W/ SIPS OF WATER.

## 2017-03-18 ENCOUNTER — Encounter (HOSPITAL_BASED_OUTPATIENT_CLINIC_OR_DEPARTMENT_OTHER): Payer: Self-pay

## 2017-03-18 ENCOUNTER — Ambulatory Visit (HOSPITAL_BASED_OUTPATIENT_CLINIC_OR_DEPARTMENT_OTHER)
Admission: RE | Admit: 2017-03-18 | Discharge: 2017-03-18 | Disposition: A | Discharge status: Home / Self Care | Payer: Medicare Other | Source: Ambulatory Visit | Attending: Surgery | Admitting: Surgery

## 2017-03-18 ENCOUNTER — Ambulatory Visit (HOSPITAL_BASED_OUTPATIENT_CLINIC_OR_DEPARTMENT_OTHER): Payer: Medicare Other | Admitting: Anesthesiology

## 2017-03-18 ENCOUNTER — Encounter (HOSPITAL_BASED_OUTPATIENT_CLINIC_OR_DEPARTMENT_OTHER)
Admission: RE | Disposition: A | Discharge status: Home / Self Care | Payer: Self-pay | Source: Ambulatory Visit | Attending: Surgery

## 2017-03-18 DIAGNOSIS — K642 Third degree hemorrhoids: Secondary | ICD-10-CM | POA: Diagnosis not present

## 2017-03-18 DIAGNOSIS — Z8719 Personal history of other diseases of the digestive system: Secondary | ICD-10-CM | POA: Insufficient documentation

## 2017-03-18 DIAGNOSIS — G4730 Sleep apnea, unspecified: Secondary | ICD-10-CM | POA: Insufficient documentation

## 2017-03-18 DIAGNOSIS — K644 Residual hemorrhoidal skin tags: Secondary | ICD-10-CM | POA: Diagnosis not present

## 2017-03-18 DIAGNOSIS — K643 Fourth degree hemorrhoids: Secondary | ICD-10-CM | POA: Diagnosis not present

## 2017-03-18 DIAGNOSIS — K649 Unspecified hemorrhoids: Secondary | ICD-10-CM | POA: Diagnosis not present

## 2017-03-18 DIAGNOSIS — K219 Gastro-esophageal reflux disease without esophagitis: Secondary | ICD-10-CM | POA: Insufficient documentation

## 2017-03-18 DIAGNOSIS — Z79899 Other long term (current) drug therapy: Secondary | ICD-10-CM | POA: Insufficient documentation

## 2017-03-18 DIAGNOSIS — K648 Other hemorrhoids: Secondary | ICD-10-CM | POA: Diagnosis not present

## 2017-03-18 DIAGNOSIS — D509 Iron deficiency anemia, unspecified: Secondary | ICD-10-CM | POA: Insufficient documentation

## 2017-03-18 DIAGNOSIS — Z800 Family history of malignant neoplasm of digestive organs: Secondary | ICD-10-CM | POA: Diagnosis not present

## 2017-03-18 DIAGNOSIS — E7800 Pure hypercholesterolemia, unspecified: Secondary | ICD-10-CM | POA: Insufficient documentation

## 2017-03-18 DIAGNOSIS — Z8249 Family history of ischemic heart disease and other diseases of the circulatory system: Secondary | ICD-10-CM | POA: Diagnosis not present

## 2017-03-18 DIAGNOSIS — Z885 Allergy status to narcotic agent status: Secondary | ICD-10-CM | POA: Insufficient documentation

## 2017-03-18 DIAGNOSIS — I10 Essential (primary) hypertension: Secondary | ICD-10-CM | POA: Insufficient documentation

## 2017-03-18 HISTORY — DX: Presence of spectacles and contact lenses: Z97.3

## 2017-03-18 HISTORY — PX: EVALUATION UNDER ANESTHESIA WITH HEMORRHOIDECTOMY: SHX5624

## 2017-03-18 HISTORY — DX: Obstructive sleep apnea (adult) (pediatric): G47.33

## 2017-03-18 HISTORY — DX: Personal history of colon polyps, unspecified: Z86.0100

## 2017-03-18 HISTORY — DX: Personal history of other diseases of the nervous system and sense organs: Z86.69

## 2017-03-18 HISTORY — DX: Hyperlipidemia, unspecified: E78.5

## 2017-03-18 HISTORY — DX: Other specified postprocedural states: Z98.890

## 2017-03-18 HISTORY — DX: Dependence on other enabling machines and devices: Z99.89

## 2017-03-18 HISTORY — DX: Personal history of colonic polyps: Z86.010

## 2017-03-18 LAB — CBC WITH DIFFERENTIAL/PLATELET
BASOS ABS: 0 10*3/uL (ref 0.0–0.1)
Basophils Relative: 1 %
Eosinophils Absolute: 0.2 10*3/uL (ref 0.0–0.7)
Eosinophils Relative: 3 %
HEMATOCRIT: 35.9 % — AB (ref 39.0–52.0)
HEMOGLOBIN: 11.8 g/dL — AB (ref 13.0–17.0)
LYMPHS PCT: 27 %
Lymphs Abs: 1.3 10*3/uL (ref 0.7–4.0)
MCH: 29 pg (ref 26.0–34.0)
MCHC: 32.9 g/dL (ref 30.0–36.0)
MCV: 88.2 fL (ref 78.0–100.0)
Monocytes Absolute: 0.5 10*3/uL (ref 0.1–1.0)
Monocytes Relative: 11 %
NEUTROS ABS: 2.8 10*3/uL (ref 1.7–7.7)
Neutrophils Relative %: 58 %
PLATELETS: 231 10*3/uL (ref 150–400)
RBC: 4.07 MIL/uL — AB (ref 4.22–5.81)
RDW: 14.8 % (ref 11.5–15.5)
WBC: 4.7 10*3/uL (ref 4.0–10.5)

## 2017-03-18 LAB — COMPREHENSIVE METABOLIC PANEL
ALT: 31 U/L (ref 17–63)
AST: 49 U/L — AB (ref 15–41)
Albumin: 3.7 g/dL (ref 3.5–5.0)
Alkaline Phosphatase: 67 U/L (ref 38–126)
Anion gap: 14 (ref 5–15)
BILIRUBIN TOTAL: 0.6 mg/dL (ref 0.3–1.2)
BUN: 6 mg/dL (ref 6–20)
CHLORIDE: 95 mmol/L — AB (ref 101–111)
CO2: 28 mmol/L (ref 22–32)
CREATININE: 0.92 mg/dL (ref 0.61–1.24)
Calcium: 9.1 mg/dL (ref 8.9–10.3)
GFR calc Af Amer: >60 mL/min (ref >60)
Glucose, Bld: 142 mg/dL — ABNORMAL HIGH (ref 65–99)
Potassium: 2.8 mmol/L — ABNORMAL LOW (ref 3.5–5.1)
Sodium: 137 mmol/L (ref 135–145)
Total Protein: 7.1 g/dL (ref 6.5–8.1)

## 2017-03-18 SURGERY — EXAM UNDER ANESTHESIA WITH HEMORRHOIDECTOMY
Anesthesia: General | Site: Rectum

## 2017-03-18 MED ORDER — EPHEDRINE SULFATE-NACL 50-0.9 MG/10ML-% IV SOSY
PREFILLED_SYRINGE | INTRAVENOUS | Status: DC | PRN
  Administered 2017-03-18: 10 mg via INTRAVENOUS

## 2017-03-18 MED ORDER — MIDAZOLAM HCL 2 MG/2ML IJ SOLN
INTRAMUSCULAR | Status: DC | PRN
  Administered 2017-03-18: 2 mg via INTRAVENOUS

## 2017-03-18 MED ORDER — BUPIVACAINE LIPOSOME 1.3 % IJ SUSP
INTRAMUSCULAR | Status: DC | PRN
  Administered 2017-03-18: 20 mL

## 2017-03-18 MED ORDER — ONDANSETRON HCL 4 MG/2ML IJ SOLN
INTRAMUSCULAR | Status: DC | PRN
  Administered 2017-03-18: 4 mg via INTRAVENOUS

## 2017-03-18 MED ORDER — BUPIVACAINE LIPOSOME 1.3 % IJ SUSP
INTRAMUSCULAR | Status: AC
  Filled 2017-03-18: qty 20

## 2017-03-18 MED ORDER — TAMSULOSIN HCL 0.4 MG PO CAPS
ORAL_CAPSULE | ORAL | Status: AC
  Filled 2017-03-18: qty 1

## 2017-03-18 MED ORDER — OXYCODONE HCL 5 MG PO TABS: 5.0000 mg | ORAL_TABLET | Freq: Four times a day (QID) | ORAL | 0 refills | Status: AC | PRN

## 2017-03-18 MED ORDER — FENTANYL CITRATE (PF) 100 MCG/2ML IJ SOLN
INTRAMUSCULAR | Status: DC | PRN
  Administered 2017-03-18 (×4): 50 ug via INTRAVENOUS

## 2017-03-18 MED ORDER — BUPIVACAINE HCL (PF) 0.25 % IJ SOLN
INTRAMUSCULAR | Status: DC | PRN
  Administered 2017-03-18: 20 mL

## 2017-03-18 MED ORDER — SCOPOLAMINE 1 MG/3DAYS TD PT72
1.0000 | MEDICATED_PATCH | TRANSDERMAL | Status: DC
  Administered 2017-03-18: 1.5 mg via TRANSDERMAL
  Filled 2017-03-18: qty 1

## 2017-03-18 MED ORDER — BUPIVACAINE-EPINEPHRINE (PF) 0.5% -1:200000 IJ SOLN
INTRAMUSCULAR | Status: AC
  Filled 2017-03-18: qty 30

## 2017-03-18 MED ORDER — GABAPENTIN 300 MG PO CAPS
300.0000 mg | ORAL_CAPSULE | ORAL | Status: AC
  Administered 2017-03-18: 300 mg via ORAL
  Filled 2017-03-18: qty 1

## 2017-03-18 MED ORDER — ACETAMINOPHEN 500 MG PO TABS
1000.0000 mg | ORAL_TABLET | ORAL | Status: AC
  Administered 2017-03-18: 1000 mg via ORAL
  Filled 2017-03-18: qty 2

## 2017-03-18 MED ORDER — SUCCINYLCHOLINE CHLORIDE 200 MG/10ML IV SOSY
PREFILLED_SYRINGE | INTRAVENOUS | Status: DC | PRN
  Administered 2017-03-18: 120 mg via INTRAVENOUS

## 2017-03-18 MED ORDER — DEXAMETHASONE SODIUM PHOSPHATE 10 MG/ML IJ SOLN
INTRAMUSCULAR | Status: AC
  Filled 2017-03-18: qty 1

## 2017-03-18 MED ORDER — SUCCINYLCHOLINE CHLORIDE 200 MG/10ML IV SOSY
PREFILLED_SYRINGE | INTRAVENOUS | Status: AC
  Filled 2017-03-18: qty 10

## 2017-03-18 MED ORDER — CEFOTETAN DISODIUM-DEXTROSE 2-2.08 GM-% IV SOLR
INTRAVENOUS | Status: AC
  Filled 2017-03-18: qty 50

## 2017-03-18 MED ORDER — PROPOFOL 10 MG/ML IV BOLUS
INTRAVENOUS | Status: AC
  Filled 2017-03-18: qty 20

## 2017-03-18 MED ORDER — SCOPOLAMINE 1 MG/3DAYS TD PT72
MEDICATED_PATCH | TRANSDERMAL | Status: AC
  Filled 2017-03-18: qty 1

## 2017-03-18 MED ORDER — PROPOFOL 10 MG/ML IV BOLUS
INTRAVENOUS | Status: DC | PRN
  Administered 2017-03-18: 30 mg via INTRAVENOUS
  Administered 2017-03-18: 170 mg via INTRAVENOUS

## 2017-03-18 MED ORDER — TAMSULOSIN HCL 0.4 MG PO CAPS: 0.4000 mg | ORAL_CAPSULE | Freq: Every day | ORAL | 0 refills | Status: AC

## 2017-03-18 MED ORDER — FENTANYL CITRATE (PF) 100 MCG/2ML IJ SOLN
INTRAMUSCULAR | Status: AC
  Filled 2017-03-18: qty 2

## 2017-03-18 MED ORDER — CEFOTETAN DISODIUM 2 G IJ SOLR
2.0000 g | INTRAMUSCULAR | Status: AC
  Administered 2017-03-18: 2 g via INTRAVENOUS
  Filled 2017-03-18: qty 2

## 2017-03-18 MED ORDER — BUPIVACAINE HCL (PF) 0.25 % IJ SOLN
INTRAMUSCULAR | Status: AC
  Filled 2017-03-18: qty 30

## 2017-03-18 MED ORDER — LIDOCAINE 5 % EX OINT
TOPICAL_OINTMENT | CUTANEOUS | Status: AC
  Filled 2017-03-18: qty 35.44

## 2017-03-18 MED ORDER — DEXAMETHASONE SODIUM PHOSPHATE 10 MG/ML IJ SOLN
INTRAMUSCULAR | Status: DC | PRN
  Administered 2017-03-18: 10 mg via INTRAVENOUS

## 2017-03-18 MED ORDER — LACTATED RINGERS IV SOLN
INTRAVENOUS | Status: DC
  Administered 2017-03-18 (×3): via INTRAVENOUS
  Filled 2017-03-18: qty 1000

## 2017-03-18 MED ORDER — HYDROMORPHONE HCL 1 MG/ML IJ SOLN
0.2500 mg | INTRAMUSCULAR | Status: DC | PRN
  Filled 2017-03-18: qty 0.5

## 2017-03-18 MED ORDER — TAMSULOSIN HCL 0.4 MG PO CAPS
0.8000 mg | ORAL_CAPSULE | Freq: Once | ORAL | Status: AC
  Administered 2017-03-18: 0.8 mg via ORAL
  Filled 2017-03-18: qty 2

## 2017-03-18 MED ORDER — ONDANSETRON HCL 4 MG/2ML IJ SOLN
INTRAMUSCULAR | Status: AC
  Filled 2017-03-18: qty 2

## 2017-03-18 MED ORDER — MIDAZOLAM HCL 2 MG/2ML IJ SOLN
INTRAMUSCULAR | Status: AC
  Filled 2017-03-18: qty 2

## 2017-03-18 MED ORDER — LIDOCAINE HCL 4 % MT SOLN
OROMUCOSAL | Status: DC | PRN
  Administered 2017-03-18: 2.5 mL via TOPICAL

## 2017-03-18 MED ORDER — EPHEDRINE 5 MG/ML INJ
INTRAVENOUS | Status: AC
  Filled 2017-03-18: qty 10

## 2017-03-18 MED ORDER — CHLORHEXIDINE GLUCONATE CLOTH 2 % EX PADS
6.0000 | MEDICATED_PAD | Freq: Once | CUTANEOUS | Status: DC
  Filled 2017-03-18: qty 6

## 2017-03-18 MED ORDER — LIDOCAINE 2% (20 MG/ML) 5 ML SYRINGE
INTRAMUSCULAR | Status: AC
  Filled 2017-03-18: qty 5

## 2017-03-18 MED ORDER — GABAPENTIN 300 MG PO CAPS
ORAL_CAPSULE | ORAL | Status: AC
  Filled 2017-03-18: qty 1

## 2017-03-18 MED ORDER — PROMETHAZINE HCL 25 MG/ML IJ SOLN
6.2500 mg | INTRAMUSCULAR | Status: DC | PRN
  Filled 2017-03-18: qty 1

## 2017-03-18 MED ORDER — LIDOCAINE 2% (20 MG/ML) 5 ML SYRINGE
INTRAMUSCULAR | Status: DC | PRN
  Administered 2017-03-18: 100 mg via INTRAVENOUS

## 2017-03-18 MED ORDER — ACETAMINOPHEN 500 MG PO TABS
ORAL_TABLET | ORAL | Status: AC
  Filled 2017-03-18: qty 2

## 2017-03-18 SURGICAL SUPPLY — 52 items
BLADE HEX COATED 2.75 (ELECTRODE) ×3 IMPLANT
BLADE SURG 10 STRL SS (BLADE) ×3 IMPLANT
BLADE SURG 15 STRL LF DISP TIS (BLADE) ×1 IMPLANT
BLADE SURG 15 STRL SS (BLADE) ×2
BRIEF STRETCH FOR OB PAD LRG (UNDERPADS AND DIAPERS) ×3 IMPLANT
COVER BACK TABLE 60X90IN (DRAPES) ×3 IMPLANT
COVER MAYO STAND STRL (DRAPES) ×3 IMPLANT
DRAPE LAPAROTOMY 100X72 PEDS (DRAPES) ×3 IMPLANT
DRAPE UTILITY XL STRL (DRAPES) IMPLANT
ELECT BLADE 6.5 .24CM SHAFT (ELECTRODE) IMPLANT
ELECT REM PT RETURN 9FT ADLT (ELECTROSURGICAL) ×3
ELECTRODE REM PT RTRN 9FT ADLT (ELECTROSURGICAL) ×1 IMPLANT
GAUZE SPONGE 4X4 12PLY STRL LF (GAUZE/BANDAGES/DRESSINGS) ×3 IMPLANT
GAUZE SPONGE 4X4 16PLY XRAY LF (GAUZE/BANDAGES/DRESSINGS) ×3 IMPLANT
GLOVE BIO SURGEON STRL SZ 6.5 (GLOVE) IMPLANT
GLOVE BIO SURGEON STRL SZ7.5 (GLOVE) ×3 IMPLANT
GLOVE BIO SURGEONS STRL SZ 6.5 (GLOVE)
GLOVE BIOGEL PI IND STRL 7.0 (GLOVE) ×1 IMPLANT
GLOVE BIOGEL PI IND STRL 7.5 (GLOVE) ×2 IMPLANT
GLOVE BIOGEL PI INDICATOR 7.0 (GLOVE) ×2
GLOVE BIOGEL PI INDICATOR 7.5 (GLOVE) ×4
GLOVE INDICATOR 7.0 STRL GRN (GLOVE) IMPLANT
GLOVE INDICATOR 8.0 STRL GRN (GLOVE) ×3 IMPLANT
GOWN STRL REUS W/TWL 2XL LVL3 (GOWN DISPOSABLE) IMPLANT
KIT RM TURNOVER CYSTO AR (KITS) ×3 IMPLANT
NEEDLE HYPO 22GX1.5 SAFETY (NEEDLE) ×3 IMPLANT
NS IRRIG 500ML POUR BTL (IV SOLUTION) ×3 IMPLANT
PACK BASIN DAY SURGERY FS (CUSTOM PROCEDURE TRAY) ×3 IMPLANT
PAD ABD 8X10 STRL (GAUZE/BANDAGES/DRESSINGS) ×3 IMPLANT
PAD ARMBOARD 7.5X6 YLW CONV (MISCELLANEOUS) ×3 IMPLANT
PENCIL BUTTON HOLSTER BLD 10FT (ELECTRODE) ×3 IMPLANT
SPONGE GAUZE 4X4 12PLY (GAUZE/BANDAGES/DRESSINGS) ×3 IMPLANT
SPONGE HEMORRHOID 8X3CM (HEMOSTASIS) ×3 IMPLANT
SPONGE SURGIFOAM ABS GEL 100 (HEMOSTASIS) IMPLANT
SPONGE SURGIFOAM ABS GEL 12-7 (HEMOSTASIS) IMPLANT
SUT CHROMIC 2 0 SH (SUTURE) IMPLANT
SUT CHROMIC 3 0 SH 27 (SUTURE) IMPLANT
SUT CHROMIC 4 0 SH 27 (SUTURE) ×9 IMPLANT
SUT PROLENE 2 0 BLUE (SUTURE) IMPLANT
SUT VIC AB 2-0 SH 27 (SUTURE)
SUT VIC AB 2-0 SH 27XBRD (SUTURE) IMPLANT
SUT VIC AB 3-0 SH 27 (SUTURE) ×8
SUT VIC AB 3-0 SH 27X BRD (SUTURE) ×4 IMPLANT
SUT VIC AB 4-0 P-3 18XBRD (SUTURE) IMPLANT
SUT VIC AB 4-0 P3 18 (SUTURE)
SUT VIC AB 4-0 SH 18 (SUTURE) IMPLANT
SYR CONTROL 10ML LL (SYRINGE) ×3 IMPLANT
TRAY DSU PREP LF (CUSTOM PROCEDURE TRAY) ×3 IMPLANT
TUBE CONNECTING 12'X1/4 (SUCTIONS) ×1
TUBE CONNECTING 12X1/4 (SUCTIONS) ×2 IMPLANT
WATER STERILE IRR 500ML POUR (IV SOLUTION) IMPLANT
YANKAUER SUCT BULB TIP NO VENT (SUCTIONS) ×3 IMPLANT

## 2017-03-18 NOTE — Discharge Instructions (Addendum)
ANORECTAL SURGERY: POST OP INSTRUCTIONS  1. DIET: Follow a light bland diet the first 24 hours after arrival home, such as soup, liquids, crackers, etc - advance as tolerated.  Be sure to include lots of fluids daily.  Avoid fast food or heavy meals as your are more likely to get nauseated. Eating a healthy diet with plenty of fiber (fruits, vegetables) is a good thing.  2. Take your usually prescribed home medications unless otherwise directed.  3. PAIN CONTROL: a. It is helpful to take an over-the-counter pain medication regularly for the first few days/weeks.  Choose from the following that works best for you: i. Ibuprofen (Advil, etc) Three  tabs every 6 hours as needed. ii. Acetaminophen (Tylenol, etc) 500-650mg  every 6 hours as needed iii. NOTE: You may take both of these medications together - most patients find it most helpful when alternating between the two (i.e. Ibuprofen at 6am, tylenol at 9am, ibuprofen at 12pm ...) b. A  prescription for pain medication may have been prescribed for you at discharge.  Take your pain medication as prescribed.   4. Avoid getting constipated.  Between the surgery and the pain medications, it is common to experience some constipation.  Increasing fluid intake (64oz of water per day) and taking a fiber supplement (such as Metamucil, Citrucel, FiberCon) 1-2 times a day regularly will usually help prevent this problem from occurring.  Take Miralax (over the counter) 1-2x/day while taking a narcotic pain medication. If no bowel movement after 48hours, you may additionally take a laxative like a Milk of Magnesia or a bottle of magnesium citrate which can be purchased over the counter. Avoid enemas if possible as these are often painful.   5. Watch out for diarrhea.  If you have many loose bowel movements, you should stop the laxatives but continue the fiber supplement. If this worsens or does not improve, please call us.  6. Wash / shower every day.  If you  were discharged with a dressing, you may remove this the day after your surgery. You may shower normally, getting soap/water on your wound, particularly after bowel movements.  7. Soaking in a warm bath filled a couple inches ("Sitz bath") is a great way to clean the area after a bowel movement and many patients find it is a way to soothe the area.  8. ACTIVITIES as tolerated:   a. You may resume regular (light) daily activities beginning the next day--such as daily self-care, walking, climbing stairs--gradually increasing activities as tolerated.  If you can walk 30 minutes without difficulty, it is safe to try more intense activity such as jogging, treadmill, bicycling, low-impact aerobics, etc. b. Refrain from any heavy lifting or straining for the first 2 weeks after your procedure, particularly if your surgery was for hemorrhoids. c. DO NOT PUSH THROUGH PAIN.  Let pain be your guide: If it hurts to do something, don't do it. d. Bonita Quin may drive when you are no longer taking prescription pain medication, you can comfortably wear a seatbelt, and you can safely maneuver your car and apply brakes.  9. FOLLOW UP in our office a. Please call [facility] at [phone] to set up an appointment to see your surgeon in the office for a follow-up appointment approximately 2 weeks after your surgery. b. Make sure that you call for this appointment the day you arrive home to insure a convenient appointment time.  9. IF YOU HAVE DISABILITY OR FAMILY LEAVE FORMS, BRING THEM TO THE OFFICE FOR  PROCESSING.  WHEN TO CALL us [phone]: 1. Poor pain control 2. Reactions / problems with new medications (rash/itching, nausea, etc)  3. Fever over 101.5 F (38.5 C) 4. Inability to urinate 5. Nausea and/or vomiting 6. Worsening swelling or bruising 7. Continued bleeding from incision. 8. Increased pain, redness, or drainage from the incision  The clinic staff is available to answer your questions during regular  business hours (8:30am-5pm).  Please dont hesitate to call and ask to speak to one of our nurses for clinical concerns.   A surgeon from [facility] is always on call at the hospitals   If you have a medical emergency, go to the nearest emergency room or call 911.    [facility] [address], [address], [place], [place]  [address] ? MAIN: [phone] FAX [phone] [email or web address]  Information for Discharge Teaching: EXPAREL (bupivacaine liposome injectable suspension)   Your surgeon gave you EXPAREL(bupivacaine) in your surgical incision to help control your pain after surgery.   EXPAREL is a local anesthetic that provides pain relief by numbing the tissue around the surgical site.  EXPAREL is designed to release pain medication over time and can control pain for up to 72 hours.  Depending on how you respond to EXPAREL, you may require less pain medication during your recovery.  Possible side effects:  Temporary loss of sensation or ability to move in the area where bupivacaine was injected.  Nausea, vomiting, constipation  Rarely, numbness and tingling in your mouth or lips, lightheadedness, or anxiety may occur.  Call your doctor right away if you think you may be experiencing any of these sensations, or if you have other questions regarding possible side effects.  Follow all other discharge instructions given to you by your surgeon or nurse. Eat a healthy diet and drink plenty of water or other fluids.  If you return to the hospital for any reason within 96 hours following the administration of EXPAREL, please inform your health care providers. Post Anesthesia Home Care Instructions  Activity: Get plenty of rest for the remainder of the day. A responsible individual must stay with you for 24 hours following the procedure.  For the next 24 hours, DO NOT: -Drive a car -Advertising copywriter -Drink alcoholic  beverages -Take any medication unless instructed by your physician -Make any legal decisions or sign important papers.  Meals: Start with liquid foods such as gelatin or soup. Progress to regular foods as tolerated. Avoid greasy, spicy, heavy foods. If nausea and/or vomiting occur, drink only clear liquids until the nausea and/or vomiting subsides. Call your physician if vomiting continues.  Special Instructions/Symptoms: Your throat may feel dry or sore from the anesthesia or the breathing tube placed in your throat during surgery. If this causes discomfort, gargle with warm salt water. The discomfort should disappear within 24 hours.  If you had a scopolamine patch placed behind your ear for the management of post- operative nausea and/or vomiting:  1. The medication in the patch is effective for 72 hours, after which it should be removed.  Wrap patch in a tissue and discard in the trash. Wash hands thoroughly with soap and water. 2. You may remove the patch earlier than 72 hours if you experience unpleasant side effects which may include dry mouth, dizziness or visual disturbances. 3. Avoid touching the patch. Wash your hands with soap and water after contact with the patch.

## 2017-03-18 NOTE — Anesthesia Procedure Notes (Signed)
Procedure Name: Intubation Date/Time: 03/18/2017 10:47 AM Performed by: Duane Boston Pre-anesthesia Checklist: Patient identified, Emergency Drugs available, Suction available and Patient being monitored Patient Re-evaluated:Patient Re-evaluated prior to induction Oxygen Delivery Method: Circle system utilized Preoxygenation: Pre-oxygenation with 100% oxygen Induction Type: IV induction Ventilation: Mask ventilation without difficulty Laryngoscope Size: Mac and 4 Grade View: Grade II Tube type: Oral Tube size: 7.5 mm Number of attempts: 1 Airway Equipment and Method: Stylet and Oral airway Placement Confirmation: ETT inserted through vocal cords under direct vision,  positive ETCO2 and breath sounds checked- equal and bilateral Secured at: 23 cm Tube secured with: Tape Dental Injury: Teeth and Oropharynx as per pre-operative assessment

## 2017-03-18 NOTE — Anesthesia Postprocedure Evaluation (Signed)
Anesthesia Post Note  Patient: Shane Lynch  Procedure(s) Performed: EXAM UNDER ANESTHESIA WITH HEMORRHOIDECTOMY (N/A Rectum)     Patient location during evaluation: PACU Anesthesia Type: General Level of consciousness: sedated Pain management: pain level controlled Vital Signs Assessment: post-procedure vital signs reviewed and stable Respiratory status: spontaneous breathing and respiratory function stable Cardiovascular status: stable Postop Assessment: no apparent nausea or vomiting Anesthetic complications: no    Last Vitals:  Vitals:   03/18/17 1245 03/18/17 1300  BP: 133/78 127/79  Pulse: 70 72  Resp: 18 12  Temp:    SpO2: 96% 95%    Last Pain:  Vitals:   03/18/17 1213  TempSrc:   PainSc: Asleep                 [name]

## 2017-03-18 NOTE — Anesthesia Preprocedure Evaluation (Signed)
Anesthesia Evaluation  Patient identified by MRN, date of birth, ID band Patient awake    Reviewed: Allergy & Precautions, H&P , NPO status , Patient's Chart, lab work & pertinent test results, reviewed documented beta blocker date and time   History of Anesthesia Complications Negative for: history of anesthetic complications  Airway Mallampati: III  TM Distance: >3 FB Neck ROM: Full    Dental no notable dental hx. (+) Teeth Intact, Dental Advisory Given   Pulmonary sleep apnea and Continuous Positive Airway Pressure Ventilation ,    Pulmonary exam normal breath sounds clear to auscultation       Cardiovascular hypertension, On Medications and On Home Beta Blockers  Rhythm:Regular Rate:Normal     Neuro/Psych negative neurological ROS  negative psych ROS   GI/Hepatic Neg liver ROS, hiatal hernia, GERD  ,  Endo/Other  negative endocrine ROS  Renal/GU negative Renal ROS  negative genitourinary   Musculoskeletal   Abdominal   Peds  Hematology negative hematology ROS (+)   Anesthesia Other Findings   Reproductive/Obstetrics negative OB ROS                             Anesthesia Physical  Anesthesia Plan  ASA: III  Anesthesia Plan: General   Post-op Pain Management:    Induction: Intravenous  PONV Risk Score and Plan: 3 and Ondansetron, Dexamethasone and Scopolamine patch - Pre-op  Airway Management Planned: Oral ETT and LMA  Additional Equipment:   Intra-op Plan:   Post-operative Plan: Extubation in OR  Informed Consent: I have reviewed the patients History and Physical, chart, labs and discussed the procedure including the risks, benefits and alternatives for the proposed anesthesia with the patient or authorized representative who has indicated his/her understanding and acceptance.   Dental advisory given  Plan Discussed with: CRNA, Anesthesiologist and  Surgeon  Anesthesia Plan Comments:         Anesthesia Quick Evaluation

## 2017-03-18 NOTE — H&P (Signed)
CC: Here for surgery  HPI: 66 year old male with history of hypertension, hyperlipidemia, GERD, colon cancer in his father, recurrent iron deficiency anemia referred by Dr. Leone Payor and Dr. Marina Goodell for evaluation of his hemorrhoidal disease. He has a history of bright red blood per rectum for many years which occurs with bowel movements. He has been seen by our gastroenterologists and diagnosed with internal and external hemorrhoids. He has been taking a daily fiber supplement of psyllium husk for at least the past year. He had significant bleeding to the point of anemia requiring iron supplementation. He was found to have hemorrhoidal disease and has undergone 2 different sessions of banding. Despite this he describes trouble with external anal skin tags and remaining hemorrhoidal tissue that prolapses. For this he was sent for surgical consideration.  He has a soft BM every morning and denies straining. He reports drinking at least 8, 8oz glasses of H2O per day. He denies any history of anorectal procedures aside from the 2 banding procedures.  Last cscope 11/2013 which per report of his primary GI doctor showed pan diverticulosis and external hemorrhoids. He also underwent upper endoscopy that time which revealed an irregular GE junction the biopsies did not show Barrett's. He recently underwent capsule endoscopy but I do not have the results of that study. Currently he says the bleeding has substantially improved and he hasn't was none with the bowel movement. His anal skin tags cause some issues with hygiene and are bothersome to him and he would like these removed. Nothing makes this better or worse.  PMH: Hypertension (well controlled on oral medications); hyperlipidemia; GERD PSH: Banding 2 of anal hemorrhoids FHx: Father had what sounds like metastatic colon cancer but age wasn't clear. Mother had metastatic cancer of unknown primary. Social: He denies the use of tobacco; social  alcohol, primarily on the weekends; denies the use of illicit drugs.  Diagnostic Studies History Colonoscopy  1-5 years ago  Allergies Codeine and Related  Hives. Allergies Reconciled   Medication Atorvastatin Calcium (  Tablet, Oral) Active. Bisoprolol Fumarate (  Tablet, Oral) Active. Lisinopril-Hydrochlorothiazide (20-25MG  Tablet, Oral) Active. TraZODone HCl (  Tablet, Oral) Active. Multivitamin Adult (Oral) Active. Iron (Ferrous Sulfate) (  Tablet, Oral) Active. PriLOSEC (  Capsule DR, Oral) Active. Psyllium (0.52GM Capsule, Oral) Active. Flonase (50MCG/ACT Suspension, Nasal) Active. Medications Reconciled  Social History Alcohol use  Moderate alcohol use. Caffeine use  Tea. No drug use  Tobacco use  Never smoker.  Family History Alcohol Abuse  Father, Mother. Breast Cancer  Mother. Colon Cancer  Father. Malignant Neoplasm Of Pancreas  Father.  Other Problems Diverticulosis  Hemorrhoids  High blood pressure  Hypercholesterolemia  Sleep Apnea   Review of Systems General Not Present- Appetite Loss, Chills, Fatigue, Fever, Night Sweats, Weight Gain and Weight Loss. Skin Not Present- Change in Wart/Mole, Dryness, Hives, Jaundice, New Lesions, Non-Healing Wounds, Rash and Ulcer. HEENT Not Present- Earache, Hearing Loss, Hoarseness, Nose Bleed, Oral Ulcers, Ringing in the Ears, Seasonal Allergies, Sinus Pain, Sore Throat, Visual Disturbances, Wears glasses/contact lenses and Yellow Eyes. Respiratory Not Present- Bloody sputum, Chronic Cough, Difficulty Breathing, Snoring and Wheezing. Breast Not Present- Breast Mass, Breast Pain, Nipple Discharge and Skin Changes. Cardiovascular Not Present- Chest Pain, Difficulty Breathing Lying Down, Leg Cramps, Palpitations, Rapid Heart Rate, Shortness of Breath and Swelling of Extremities. Gastrointestinal Present- Hemorrhoids. Not Present- Abdominal Pain, Bloating, Bloody Stool, Change in  Bowel Habits, Chronic diarrhea, Constipation, Difficulty Swallowing, Excessive gas, Gets full quickly at meals, Indigestion, Nausea, Rectal Pain and Vomiting. Male  Genitourinary Not Present- Blood in Urine, Change in Urinary Stream, Frequency, Impotence, Nocturia, Painful Urination, Urgency and Urine Leakage. Musculoskeletal Not Present- Arm Weakness and Joint Pain. Neurological Not Present- Stroke and Syncope. Psychiatric Not Present- Impaired Cognitive Function and Memory Loss. Endocrine Not Present- Appetite Changes and Cold Intolerance. Hematology Not Present- Abnormal Bleeding, Blood Clots, Epistaxis, Excessive bleeding, Nose Bleed and Spontaneous Bleeding.  Physical exam: Vitals 02/26/2017 2:11 PM Weight: 241.4 lb Height: 68in Body Surface Area: 2.21 m Body Mass Index: 36.7 kg/m  Temp.: 98.87F(Temporal)  Pulse: 74 (Regular)  BP: 136/84 (Sitting, Left Arm, Standard)  No acute distress; conversant  General Mental Status-Alert. General Appearance-Not in acute distress. Orientation-Oriented X3. Gait-Normal.  Integumentary: Overall examination of the patient's skin reveals - no rashes and no evidence of scars.   Head and Neck: Trachea midline; no thyromegaly  Eye: Anicteric sclera, moist conjunctiva; no lid lag  Chest and Lung Exam: Normal respiratory effort, lungs clear to auscultation bilaterally  Cardiovascular: Regular rate and rhythm; no murmurs  Abdomen Note: Abdomen soft nontender; no hepatosplenomegaly  Rectal Note: Digital rectal exam revealed no palpable masses nor fluctuance; good anal tone Anoscopy: 3 column mixed internal and external hemorrhoids with associated anal skin tags  Neuropsychiatric Note: Appropriate affect, alert and oriented 3  Musculoskeletal Normal Exam - Bilateral-Upper Extremity Strength Normal and Lower Extremity Strength Normal. Note: No peripheral edema or extremity lymphadenopathy  Lymphatic Note: No  palpable cervical or axillary lymphadenopathy  A/P -Mixed 3: Internal and external hemorrhoids which have failed to respond to nonoperative treatments including daily fiber supplementation, 64oz of water daily, and 2 sessions of banding -The anatomy & physiology of the anorectal region was described to the patient. The pathology and pathophysiology of hemorrhoids as also described using pictures and diagrams. The procedure of excisional hemorrhoidectomy was discussed in detail. The material risks (including, but not limited to, pain, bleeding, infection, scarring, recurrence, need for additional procedures, fecal incontinence, wound dehiscence, need for transfusion), benefits, and alternatives to surgery were described in detail. His questions were answered to his satisfaction and he is requested to proceed with surgery.  Stephanie Coup. Cliffton Asters, M.D. [facility]

## 2017-03-18 NOTE — Op Note (Addendum)
03/18/2017  12:18 PM  PATIENT:  Shane Lynch  66 y.o. male  Patient Care Team: Irena Reichmann, DO as PCP - General (Family Medicine)  PRE-OPERATIVE DIAGNOSIS:  hemorrhoids  POST-OPERATIVE DIAGNOSIS:  hemorrhoids   PROCEDURE:  3 Column Hemorrhoidectomy  SURGEON:  Surgeon(s): Andria Meuse, MD  ASSISTANT: None   ANESTHESIA:   general  SPECIMEN:  Hemorrhoids  DISPOSITION OF SPECIMEN:  PATHOLOGY  COUNTS:  Sponge, needle and instrument counts were reported correct x2.  PLAN OF CARE: Discharge to home after PACU once voiding  PATIENT DISPOSITION:  PACU - hemodynamically stable.  INDICATION: 66 year old male with history of hypertension, hyperlipidemia, GERD, colon cancer in his father, recurrent iron deficiency anemia referred by Dr. Leone Payor and Dr. Marina Goodell for evaluation of his hemorrhoidal disease. He has a history of bright red blood per rectum for many years which occurs with bowel movements. He has been seen by our gastroenterologists and diagnosed with internal and external hemorrhoids. He has been taking a daily fiber supplement of psyllium husk for at least the past year. He had significant bleeding to the point of anemia requiring iron supplementation. He was found to have hemorrhoidal disease and has undergone 2 different sessions of banding. Despite this he describes trouble with external anal skin tags and remaining hemorrhoidal tissue that prolapses. For this he was sent for surgical consideration.  He has a soft BM every morning and denies straining. He reports drinking at least 8, 8oz glasses of H2O per day. He denies any history of anorectal procedures aside from the 2 banding procedures.  Last cscope 11/2013 which per report of his primary GI doctor showed pan diverticulosis and external hemorrhoids. He also underwent upper endoscopy that time which revealed an irregular GE junction the biopsies did not show Barrett's. He recently underwent capsule  endoscopy but I do not have the results of that study. Currently he says the bleeding has substantially improved and he hasn't was none with the bowel movement. His anal skin tags cause some issues with hygiene and are bothersome to him and he would like these removed. Nothing makes this better or worse. The anatomy and physiology of the anus and rectum were explained and diagrams utilized. The pathophysiology was elaborated. The procedure, material risk (including but not limited to pain, bleeding, infection, scarring, need for additional procedures, need for blood transfusion, incontinence of gas or feces, injury to surrounding structures, heart attack, stroke, death), benefits and alternatives were explained. The patient's questions were answered to their satisfaction and they elected to proceed with surgery.  OR FINDINGS: 3 column mixed internal and external hemorrhoids. One bundle showed evidence of recent bleeding as it was ulcerated with old clot adherent. Posterior midline anoderm intentionally left open as this was the location of a rather large skin tag and closure would create tension. Care was taken to ensure adequate skin bridges between the 3 column excision existed so as to avoid any anal stenosis. At the conclusion, 2 well lubricated fingers easily passed into the anal canal and there was no significant tension on the suture lines. Care was taken to not divide any muscle during this procedure  DESCRIPTION: The patient was identified in the preoperative holding area and taken to the OR. SCDs were placed.  General endotracheal anesthesia was induced without difficulty. The patient was then placed on the OR table, positioned in prone jackknife position with buttocks gently taped apart and the hair in the region clipped.  The patient was then prepped and  draped in usual sterile fashion.  A surgical timeout was performed indicating the correct patient, procedure, positioning.  A rectal block was  performed using exparel with 0.25% marcaine plain. A digital rectal exam was remarkable for hemorrhoids but no palpable masses or lesions. A Hill Ferguson retractor was placed. 3 columns of hemorrhoids were found - mixed internal and external in all 3 locations. The right posterior bundle was isolated and excised with electrocautery and passed off as specimen. Hemostasis was achieved with a combination of cautery and suture. A locking 3-0 Vicryl suture was then used to close the defect up to the anoderm at which point a 4-0 chromic was used to close the skin. Attention was then turned to the right anterior bundle. This was isolated and excised with electrocautery and passed off as specimen. Hemostasis was achieved with a combination of cautery and suture. A locking 3-0 Vicryl suture was then used to close the defect up to the anoderm at which point a 4-0 chromic was used to close the skin. Attention was then turned to the left lateral bundle. This was isolated and excised with electrocautery and passed off as specimen. Hemostasis was achieved with a combination of cautery and suture. A locking 3-0 Vicryl suture was then used to close the defect up to the anoderm at which point a 4-0 chromic was used to close the skin. Of note, ~2cm skin bridges existed between all closure lines and the anal canal easily accommodated 2 well lubricated fingers and no sphincter muscle was divided during the procedure. The anal canal was then irrigated with saline and hemostasis verified. Additional local anesthetic was infiltrated into the operative field. A gel roll was placed in the anal canal covered in lidocaine ointment. Gauze and an ABD pad were placed in the gluteal cleft. The patient was then rolled back onto a stretcher, extubated, and transported to PACU in satisfactory condition.

## 2017-03-18 NOTE — Transfer of Care (Signed)
Immediate Anesthesia Transfer of Care Note  Patient: Shane Lynch  Procedure(s) Performed: Procedure(s) (LRB): EXAM UNDER ANESTHESIA WITH HEMORRHOIDECTOMY (N/A)  Patient Location: PACU  Anesthesia Type: General  Level of Consciousness: awake, sedated, patient cooperative and responds to stimulation  Airway & Oxygen Therapy: Patient Spontanous Breathing and Patient connected to face mask oxygen  Post-op Assessment: Report given to PACU RN, Post -op Vital signs reviewed and stable and Patient moving all extremities  Post vital signs: Reviewed and stable  Complications: No apparent anesthesia complications

## 2017-03-19 ENCOUNTER — Encounter (HOSPITAL_BASED_OUTPATIENT_CLINIC_OR_DEPARTMENT_OTHER): Payer: Self-pay | Admitting: Surgery

## 2017-03-31 ENCOUNTER — Ambulatory Visit (INDEPENDENT_AMBULATORY_CARE_PROVIDER_SITE_OTHER): Payer: Medicare Other | Admitting: Internal Medicine

## 2017-03-31 ENCOUNTER — Encounter: Payer: Self-pay | Admitting: Internal Medicine

## 2017-03-31 VITALS — BP 122/70 | HR 76 | Ht 67.0 in | Wt 236.1 lb

## 2017-03-31 DIAGNOSIS — K649 Unspecified hemorrhoids: Secondary | ICD-10-CM

## 2017-03-31 DIAGNOSIS — K219 Gastro-esophageal reflux disease without esophagitis: Secondary | ICD-10-CM | POA: Diagnosis not present

## 2017-03-31 DIAGNOSIS — D509 Iron deficiency anemia, unspecified: Secondary | ICD-10-CM

## 2017-03-31 DIAGNOSIS — N644 Mastodynia: Secondary | ICD-10-CM

## 2017-03-31 MED ORDER — IRON 325 (65 FE) MG PO TABS
1.0000 | ORAL_TABLET | ORAL | 6 refills | Status: DC
Start: 2017-03-31 — End: 2017-04-20

## 2017-03-31 NOTE — Patient Instructions (Signed)
Resume your once a day iron  You may use Miralax and/or fiber for constipation  I will call you to come back in in 2 months for some labs

## 2017-03-31 NOTE — Progress Notes (Signed)
HISTORY OF PRESENT ILLNESS:  Shane Lynch is a 66 y.o. male who was initially evaluated 02/02/2017 regarding recurrent iron deficiency anemia. Evaluation of the same elsewhere. See that dictation for details. CBC revealed hemoglobin of 11.9. Ferritin level low at 18.5. Testing for celiac disease was normal. Haptoglobin was normal. He was told to continue iron. Since he had had repeated recent colonoscopy and upper endoscopy without significant findings we set him up for capsule endoscopy to rule out small bowel lesions. The examination was complete with good preparation. No significant abnormalities. He was felt possibly to have iron deficiency anemia from chronically bleeding hemorrhoids. He underwent in office banding procedure with Shane Lynch on 2 occasions. His problem improved but incompletely. Thus, he was sent for surgical hemorrhoidectomy which she underwent with Shane Lynch 2 weeks ago. Recovering nicely. Had been off iron for while due to nausea and constipation. Feeling much better in terms of his hemorrhoids. Minimal to no bleeding. He is very pleased. Last hemoglobin at the time of his surgery was 11.8. We also 1 at him to see his PCP regarding tender left breast mass. He has not. He is looking for a new PCP. His breast complaint is unchanged. He was to establish primary care with the  group. For his GERD she takes omeprazole. For the most part good control and last dietary indiscretion. He is accompanied by his wife and granddaughter  REVIEW OF SYSTEMS:  All non-GI ROS negative except for swelling of the feet  Past Medical History:  Diagnosis Date  . Diverticulosis   . GERD (gastroesophageal reflux disease)   . Hiatal hernia   . History of colon polyps   . History of detached retina repair    bilateral-  x1  left eye 06-17-2013;  x2 right eye 04-26-2011 & 07-04-2010  . Hyperlipidemia   . Hypertension   . IDA (iron deficiency anemia)   . Internal and external prolapsed  hemorrhoids   . OSA on CPAP    , sleep study- 2005  . Wears contact lenses    bilateral    Past Surgical History:  Procedure Laterality Date  . CATARACT EXTRACTION W/ INTRAOCULAR LENS IMPLANT Left 04/2013  . COLONOSCOPY WITH ESOPHAGOGASTRODUODENOSCOPY (EGD)  11-11-2013;  04-13-2015  [facility])  . EVALUATION UNDER ANESTHESIA WITH HEMORRHOIDECTOMY N/A 03/18/2017   Procedure: EXAM UNDER ANESTHESIA WITH HEMORRHOIDECTOMY;  Surgeon: Shane Lynch, Shane Lynch;  [facility] ;  Service: General;  Laterality: N/A;  . HEMORRHOID BANDING  02-02-2017   dr gessner (office)  . INGUINAL HERNIA REPAIR Left yrs ago  . RETINAL DETACHMENT SURGERY Right 04-25-2010;  recurrent 07-04-2010   dr Ashley [facility]  . SCLERAL BUCKLE Left 06/17/2013   Procedure: SCLERAL BUCKLE WITH ENDOLASER;  Surgeon: Sherrie GeorgeJohn D [name], Lynch;  [facility];  Service: Ophthalmology;  Laterality: Left;  . SHOULDER ARTHROSCOPY Left 2005  . STRABISMUS SURGERY  06/06/2011   Procedure: REPAIR STRABISMUS;  Surgeon: Shara BlazingWilliam O Young;  Location: [facility];  Service: Ophthalmology;  Laterality: Right;    Social History Shane Lynch  reports that he has never smoked. He has never used smokeless tobacco. He reports that he drinks alcohol. He reports that he does not use drugs.  family history includes Brain cancer in his mother; Colon cancer in his father; Colon polyps in his father; Liver cancer in his father; Lung cancer in his father and mother; Pancreatic cancer in his father; Ulcerative colitis in his father.  Allergies  Allergen Reactions  . Codeine Hives       PHYSICAL EXAMINATION: Vital signs: BP 122/70 (BP Location: Left Arm, Patient Position: Sitting, Cuff Size: Normal)   Pulse 76   Ht 5\' 7"  (1.702 m)   Wt 236 lb 2 oz (107.1 kg)   BMI 36.98 kg/m   Constitutional: generally well-appearing, no acute distress Psychiatric: alert and oriented x3, cooperative Eyes: extraocular  movements intact, anicteric, conjunctiva pink Mouth: oral pharynx moist, no lesions Neck: supple no lymphadenopathy Cardiovascular: heart regular rate and rhythm, no murmur Lungs: clear to auscultation bilaterally Abdomen: soft, nontender, nondistended, no obvious ascites, no peritoneal signs, normal bowel sounds, no organomegaly Rectal: Omitted Extremities: no clubbing, cyanosis, or lower extremity edema bilaterally Skin: no lesions on visible extremities Neuro: No focal deficits. Cranial nerves intact  ASSESSMENT:  #1. Recurrent iron deficiency anemia felt secondary to chronic and significant rectal bleeding from hemorrhoids. Negative extensive workup as outlined otherwise. #2. GERD. Requiring PPI for control of symptoms #3. Family history of colon cancer in his father. Multiple prior colonoscopies. Last examination 2016 negative for neoplasia #4. Tender left breast mass. Patient acknowledges that he has been advised to have this evaluated. Has not. Needs a PCP he says.   PLAN:  #1. Resume iron therapy once daily #2. Discussed fiber and MiraLAX to avoid constipation #3. Repeat ferritin and CBC in 2 months #4. We will try to assist patient and obtaining PCP, but ultimately it is his responsibility. The task has been given to my CMA to see if she can help #5. Reflux precautions #6. Continue PPI #7. Weight loss #8. Repeat screening colonoscopy 2021. Recall made #9. After hemoglobin and iron levels normal. Stop iron to see if the effect his durable  40 minutes minutes face-to-face with the patient. Greater than 50% a time use for counseling regarding his iron deficiency anemia, as cause, management strategy moving forward, monitoring, and assistance with non-GI issues as also outlined

## 2017-04-06 ENCOUNTER — Telehealth: Payer: Self-pay | Admitting: Internal Medicine

## 2017-04-06 NOTE — Telephone Encounter (Signed)
Spoke with [facility] and was told that patient could schedule with Dr. Jacquiline Doealeb Parker.  Patient wants to wait until he has recovered from recent surgery.  I sent him all the contact information for this location through mychart and told him to call them when he was ready to schedule something.  Patient agreed.

## 2017-04-20 ENCOUNTER — Encounter: Payer: Self-pay | Admitting: Family Medicine

## 2017-04-20 ENCOUNTER — Ambulatory Visit (INDEPENDENT_AMBULATORY_CARE_PROVIDER_SITE_OTHER): Payer: Medicare Other | Admitting: Family Medicine

## 2017-04-20 ENCOUNTER — Ambulatory Visit (INDEPENDENT_AMBULATORY_CARE_PROVIDER_SITE_OTHER): Payer: Medicare Other

## 2017-04-20 VITALS — BP 120/80 | HR 95 | Ht 67.0 in | Wt 239.6 lb

## 2017-04-20 DIAGNOSIS — M21611 Bunion of right foot: Secondary | ICD-10-CM | POA: Insufficient documentation

## 2017-04-20 DIAGNOSIS — G4733 Obstructive sleep apnea (adult) (pediatric): Secondary | ICD-10-CM | POA: Diagnosis not present

## 2017-04-20 DIAGNOSIS — Z1159 Encounter for screening for other viral diseases: Secondary | ICD-10-CM | POA: Diagnosis not present

## 2017-04-20 DIAGNOSIS — D692 Other nonthrombocytopenic purpura: Secondary | ICD-10-CM | POA: Diagnosis not present

## 2017-04-20 DIAGNOSIS — E785 Hyperlipidemia, unspecified: Secondary | ICD-10-CM

## 2017-04-20 DIAGNOSIS — B354 Tinea corporis: Secondary | ICD-10-CM | POA: Diagnosis not present

## 2017-04-20 DIAGNOSIS — N62 Hypertrophy of breast: Secondary | ICD-10-CM

## 2017-04-20 DIAGNOSIS — G8929 Other chronic pain: Secondary | ICD-10-CM

## 2017-04-20 DIAGNOSIS — Z114 Encounter for screening for human immunodeficiency virus [HIV]: Secondary | ICD-10-CM

## 2017-04-20 DIAGNOSIS — L732 Hidradenitis suppurativa: Secondary | ICD-10-CM | POA: Diagnosis not present

## 2017-04-20 DIAGNOSIS — R739 Hyperglycemia, unspecified: Secondary | ICD-10-CM

## 2017-04-20 DIAGNOSIS — Z9989 Dependence on other enabling machines and devices: Secondary | ICD-10-CM | POA: Diagnosis not present

## 2017-04-20 DIAGNOSIS — M25562 Pain in left knee: Secondary | ICD-10-CM | POA: Diagnosis not present

## 2017-04-20 DIAGNOSIS — M21612 Bunion of left foot: Secondary | ICD-10-CM

## 2017-04-20 DIAGNOSIS — M179 Osteoarthritis of knee, unspecified: Secondary | ICD-10-CM | POA: Diagnosis not present

## 2017-04-20 LAB — CBC
HEMATOCRIT: 38.2 % — AB (ref 39.0–52.0)
HEMOGLOBIN: 12.5 g/dL — AB (ref 13.0–17.0)
MCHC: 32.7 g/dL (ref 30.0–36.0)
MCV: 86.8 fl (ref 78.0–100.0)
Platelets: 261 10*3/uL (ref 150.0–400.0)
RBC: 4.4 Mil/uL (ref 4.22–5.81)
RDW: 19 % — AB (ref 11.5–15.5)
WBC: 4.3 10*3/uL (ref 4.0–10.5)

## 2017-04-20 LAB — COMPREHENSIVE METABOLIC PANEL
ALT: 28 U/L (ref 0–53)
AST: 43 U/L — ABNORMAL HIGH (ref 0–37)
Albumin: 4.1 g/dL (ref 3.5–5.2)
Alkaline Phosphatase: 71 U/L (ref 39–117)
BUN: 6 mg/dL (ref 6–23)
CHLORIDE: 101 meq/L (ref 96–112)
CO2: 28 meq/L (ref 19–32)
Calcium: 9.9 mg/dL (ref 8.4–10.5)
Creatinine, Ser: 0.84 mg/dL (ref 0.40–1.50)
GFR: 97.25 mL/min (ref >60.00)
GLUCOSE: 121 mg/dL — AB (ref 70–99)
POTASSIUM: 3.4 meq/L — AB (ref 3.5–5.1)
Sodium: 142 mEq/L (ref 135–145)
Total Bilirubin: 0.7 mg/dL (ref 0.2–1.2)
Total Protein: 7.4 g/dL (ref 6.0–8.3)

## 2017-04-20 LAB — TSH: TSH: 1.64 u[IU]/mL (ref 0.35–4.50)

## 2017-04-20 LAB — LIPID PANEL
CHOL/HDL RATIO: 3
Cholesterol: 154 mg/dL (ref 0–200)
HDL: 44.2 mg/dL (ref >39.00)
LDL CALC: 85 mg/dL (ref 0–99)
NonHDL: 109.8
Triglycerides: 124 mg/dL (ref 0.0–149.0)
VLDL: 24.8 mg/dL (ref 0.0–40.0)

## 2017-04-20 LAB — HEMOGLOBIN A1C: Hgb A1c MFr Bld: 6.3 % (ref 4.6–6.5)

## 2017-04-20 MED ORDER — CHLORHEXIDINE GLUCONATE 4 % EX LIQD: Freq: Every day | CUTANEOUS | 0 refills | Status: AC | PRN

## 2017-04-20 NOTE — Assessment & Plan Note (Signed)
No masses or lumps noted.  Check prolactin and mammogram.  Discussed role of adipose tissue, estrogen and obesity.  Encouraged patient to work on his weight.

## 2017-04-20 NOTE — Patient Instructions (Signed)
Please use the hibiclens.  I will give an order for CPAP supplies - your insurance may require an updated sleep study.  We will check blood work and an Personal assistantxray for your breast tenderness. This is likely benign and due to excess adipose.  Your knee pain is likely osteoarthritis. Use tylenol as needed. Come back soon to get an xray.  Try lotrimin for your foot.  Try lotion for the dyrness on your abdomen.  The spots on your feet are due to varicose veins. Please keep your legs elevated.  We will check blood work today.  Come back to see me in 6 months, or sooner as needed.  Take care,  Dr Jimmey RalphParker

## 2017-04-20 NOTE — Assessment & Plan Note (Signed)
Likely osteoarthritis.  Recommended Tylenol as needed.  Check plain film to assess for degenerative changes.  Return precautions reviewed.  Consider steroid injection in the future.

## 2017-04-20 NOTE — Progress Notes (Signed)
Subjective:  Shane Lynch is a 66 y.o. male who presents today with a chief complaint of OSA and to establish care.   HPI:  Obstructive sleep apnea, chronic problem, new this provider Patient underwent sleep study in 2002.  He has been on CPAP for several years.  Currently uses 10 mmHg settings.  Compliant with this.  He recently switched insurance and needs a new medical supply provider.  Requesting an order for his CPAP supplies today.  Left breast tenderness, new problem Patient has noticed increased swelling and tenderness to his left breast for the past year.  Symptoms are worsened over the past few months.  No obvious precipitating events.  No nipple discharge.  No redness.  No medication changes.  Skin infection, new problem Patient also with recurrent punctation pimples" under his armpits bilaterally.  He has tried using antifungal cream which did not help significantly.  Sometimes her lesions become so large that he has to burst him to let them drain.  No fevers or chills.  No current severe lesions.  Skin lesion, new problem Patient also with skin lesion along the lateral aspect of his left ankle.  It has been there for several months.  He has tried several over-the-counter remedies including lotion and cortisone which have not significantly seem to help.  Lesion is very pruritic.  No erythema.  No drainage.  Pigmented spots on legs, new problem Patient also concerned about dark spots on his feet.  Concerned there may be a sign of diabetes.  No pain.  No trauma.  No obvious precipitating events.  Patient has a history of varicose veins.  Legs will occasionally swell towards the end of the day, however this usually resolves by the next day.  No treatments tried.  Left knee pain, new problem Patient also with several year history of left knee pain.  Pain is worse after sitting for long periods of time.  Occasionally feels a grinding sensation.  Very rarely knee buckles.  No  swelling.  No pain.  Uses Tylenol and Motrin which helped a little bit.  No history of trauma.  No obvious precipitating events.  Foot pain, new problem Patient also with bilateral pain in his great toe bilaterally.  This is been ongoing for several years however has worsened over the last few months.  No specific treatments tried.  ROS: Per HPI, otherwise a 14 point review of systems was performed and was negative  PMH:  The following were reviewed and entered/updated in epic: Past Medical History:  Diagnosis Date  . Diverticulosis   . Genital warts   . GERD (gastroesophageal reflux disease)   . Hiatal hernia   . History of colon polyps   . History of detached retina repair    bilateral-  x1  left eye 06-17-2013;  x2 right eye 04-26-2011 & 07-04-2010  . Hyperlipidemia   . Hypertension   . IDA (iron deficiency anemia)   . Internal and external prolapsed hemorrhoids   . OSA on CPAP    , sleep study- 2005  . Wears contact lenses    bilateral   Patient Active Problem List   Diagnosis Date Noted  . OSA on CPAP 04/20/2017  . Gynecomastia 04/20/2017  . Hidradenitis 04/20/2017  . Senile purpura (HCC) 04/20/2017  . Left knee pain 04/20/2017  . Bilateral bunions 04/20/2017  . Internal and external prolapsed hemorrhoids 02/18/2017  . Anal skin tags 02/18/2017  . Rhegmatogenous retinal detachment of left eye 06/17/2013  Past Surgical History:  Procedure Laterality Date  . CATARACT EXTRACTION W/ INTRAOCULAR LENS IMPLANT Left 04/2013  . COLONOSCOPY WITH ESOPHAGOGASTRODUODENOSCOPY (EGD)  11-11-2013;  04-13-2015  [facility]  . HEMORRHOID BANDING  02-02-2017   dr Leone Payor (office)  . INGUINAL HERNIA REPAIR Left yrs ago  . RETINAL DETACHMENT SURGERY Right 04-25-2010;  recurrent 07-04-2010   dr Ashley Royalty [facility])  . SHOULDER ARTHROSCOPY Left 2005    Family History  Problem Relation Age of Onset  . Brain cancer Mother        and spine  . Lung cancer Mother        oral  cancer  . Alcohol abuse Mother   . Cancer Mother   . Early death Mother   . Hypertension Mother   . Hyperlipidemia Mother   . Colon cancer Father   . Lung cancer Father   . Pancreatic cancer Father   . Liver cancer Father   . Colon polyps Father   . Ulcerative colitis Father   . Alcohol abuse Father     Medications- reviewed and updated Current Outpatient Medications  Medication Sig Dispense Refill  . atorvastatin (LIPITOR) 20 MG tablet Take 20 mg by mouth every evening.     . bisoprolol (ZEBETA) 10 MG tablet Take 10 mg by mouth every morning.     . fluticasone (FLONASE) 50 MCG/ACT nasal spray Place 1 spray into both nostrils as needed.     . indomethacin (INDOCIN SR) 75 MG CR capsule Take 75 mg as needed by mouth.    . IRON PO Take 65 mg by mouth.    Marland Kitchen lisinopril-hydrochlorothiazide (PRINZIDE,ZESTORETIC) 20-25 MG tablet Take 1 tablet by mouth every morning.    . Multiple Vitamin (MULTIVITAMIN) capsule Take 1 capsule by mouth every morning.     Marland Kitchen omeprazole (PRILOSEC) 10 MG capsule Take 20 mg by mouth every morning.     . traZODone (DESYREL) 100 MG tablet Take 100 mg by mouth at bedtime as needed.     . chlorhexidine (HIBICLENS) 4 % external liquid Apply daily as needed topically. 120 mL 0   No current facility-administered medications for this visit.     Allergies-reviewed and updated Allergies  Allergen Reactions  . Codeine Hives  . Tramadol Other (See Comments)    intolerance    Social History   Socioeconomic History  . Marital status: Married    Spouse name: None  . Number of children: None  . Years of education: None  . Highest education level: None  Social Needs  . Financial resource strain: None  . Food insecurity - worry: None  . Food insecurity - inability: None  . Transportation needs - medical: None  . Transportation needs - non-medical: None  Occupational History  . Occupation: Retired   Tobacco Use  . Smoking status: Never Smoker  . Smokeless  tobacco: Never Used  Substance and Sexual Activity  . Alcohol use: Yes    Comment: occasional  . Drug use: No  . Sexual activity: Yes    Partners: Female  Other Topics Concern  . None  Social History Narrative  . None     Objective:  Physical Exam: BP 120/80   Pulse 95   Ht 5\' 7"  (1.702 m)   Wt 239 lb 9.6 oz (108.7 kg)   SpO2 97%   BMI 37.53 kg/m   Gen: NAD, resting comfortably CV: RRR with no murmurs appreciated Pulm: NWOB, CTAB with no crackles, wheezes, or rhonchi Breast: Bilateral gynecomastia  left greater than right.  Tender to palpation under the nipple.  No significant masses or lumps noted.  No nipple discharge. GI: Normal bowel sounds present. Soft, Nontender, Nondistended. MSK:  -Left knee: No deformities.  Full range of motion.  Strength 5 out of 5 in all directions.  Crepitus noticed with passive range of motion.  Anterior and posterior drawer signs negative.  Stable to varus and valgus stress. -Feet: Bunion noted at bilateral great toe.  Full range of motion.  Mild loss of transverse arch bilaterally. Skin: -Axilla: Small pustules noted bilaterally without frank abscess. -Lower extremities: Significant varicosities noted bilaterally.  Purpura on feet noted bilaterally. Neuro: Grossly normal, moves all extremities Psych: Normal affect and thought content  Assessment/Plan:  OSA on CPAP Order for CPAP supplies given. Informed patient he may need to repeat sleep study for insurance approvial.   Gynecomastia No masses or lumps noted.  Check prolactin and mammogram.  Discussed role of adipose tissue, estrogen and obesity.  Encouraged patient to work on his weight.  Hidradenitis No abscesses today or signs of systemic infection.  Start Hibiclens wash daily.  Return precautions reviewed.  Senile purpura (HCC) Likely secondary to varicose veins and dilated capillaries.  Reassured patient.  Left knee pain Likely osteoarthritis.  Recommended Tylenol as needed.   Check plain film to assess for degenerative changes.  Return precautions reviewed.  Consider steroid injection in the future.  Bilateral bunions Likely the source of patient's foot pain.  Exam and symptoms not consistent with gout.  Advised patient to see our sports medicine specialist at his convenience, would likely benefit from orthotics.  Continue Tylenol in the interim.  Tinea corporis Lesion on patient's ankle consistent with superficial tinea infection.  Recommended over-the-counter Lotrimin.  Return precautions reviewed.  Follow-up as needed.  Preventative healthcare Check basic labs, lipid panel, A1c, HIV antibody, and hepatitis C antibody.  Patient is up-to-date on his colonoscopy.  He reports that he is up-to-date with his tetanus and flu shots.  Will obtain records from his prior PCP.  Time Spent: I spent 60 minutes face-to-face with the patient, with more than half spent on counseling for management and treatment for his breast tenderness, hidradenitis, varicose veins/purpura, tinea corporis, knee pain, and bilateral bunions.   Katina Degreealeb M. Jimmey RalphParker, MD 04/20/2017 11:20 AM

## 2017-04-20 NOTE — Assessment & Plan Note (Signed)
Likely secondary to varicose veins and dilated capillaries.  Reassured patient.

## 2017-04-20 NOTE — Assessment & Plan Note (Signed)
Order for CPAP supplies given. Informed patient he may need to repeat sleep study for insurance approvial.

## 2017-04-20 NOTE — Assessment & Plan Note (Signed)
No abscesses today or signs of systemic infection.  Start Hibiclens wash daily.  Return precautions reviewed.

## 2017-04-20 NOTE — Assessment & Plan Note (Signed)
Likely the source of patient's foot pain.  Exam and symptoms not consistent with gout.  Advised patient to see our sports medicine specialist at his convenience, would likely benefit from orthotics.  Continue Tylenol in the interim.

## 2017-04-21 LAB — HIV ANTIBODY (ROUTINE TESTING W REFLEX): HIV: NONREACTIVE

## 2017-04-21 LAB — PROLACTIN: Prolactin: 8.9 ng/mL (ref 2.0–18.0)

## 2017-04-21 LAB — HEPATITIS C ANTIBODY
Hepatitis C Ab: NONREACTIVE
SIGNAL TO CUT-OFF: 0.27 (ref <1.00)

## 2017-04-23 ENCOUNTER — Telehealth: Payer: Self-pay | Admitting: Family Medicine

## 2017-04-23 NOTE — Telephone Encounter (Signed)
What is patient current pressure setting on his PAP equipment? Please advise.

## 2017-04-24 ENCOUNTER — Telehealth: Payer: Self-pay | Admitting: Family Medicine

## 2017-04-24 MED ORDER — DICLOFENAC SODIUM 75 MG PO TBEC: 75.0000 mg | DELAYED_RELEASE_TABLET | Freq: Two times a day (BID) | ORAL | 0 refills | Status: DC

## 2017-04-24 NOTE — Telephone Encounter (Signed)
Diclofenac sent in.  Patient should not take any other NSAIDs (ibuprofen, naproxen, etc) while taking diclofenac.  Katina Degreealeb M. Jimmey RalphParker, MD 04/24/2017 1:18 PM

## 2017-04-24 NOTE — Telephone Encounter (Signed)
Patient called in reference to x ray results done on 04/20/17. Patient also stated he has not heard from [facility] for an appointment. Please call patient and advise. OK to leave message.

## 2017-04-24 NOTE — Telephone Encounter (Signed)
Order for Mammogram was entered on 04/20/17. Can you check to see if this can be scheduled early next week? Pt is wanting appt.

## 2017-04-24 NOTE — Telephone Encounter (Signed)
Pt is aware via voicemail of medication.

## 2017-04-24 NOTE — Telephone Encounter (Signed)
-----   Message from Tempie HoistAutumn M McNeil, [place] sent at 04/24/2017  9:29 AM EST -----   ----- Message ----- From: Ardith DarkParker, Evea Sheek M, MD Sent: 04/21/2017   8:45 AM To: Tempie HoistAutumn M McNeil, CMA  Hep C and HIV test normal.  Cholesterol level normal.  Blood counts are improving.  Prolactin (the hormone that can cause breast swelling) is normal.  His blood sugar is elevated to the prediabetes range. I can send in a medication to help prevent him progressing to diabetes if he is willing to start it. Regardless, he should continue working on diet and exercise and we can recheck in 6 months.  His knee xray showed mild arthritis.   We are still waiting on his breast imaging.   Katina Degreealeb M. Jimmey RalphParker, MD 04/21/2017 8:45 AM

## 2017-04-27 ENCOUNTER — Telehealth: Payer: Self-pay | Admitting: Family Medicine

## 2017-04-27 NOTE — Telephone Encounter (Signed)
Shane Lynch from [facility] calling to check on status of order for CPAP and November documentation. Please call. [phone] ext [phone]

## 2017-04-28 NOTE — Telephone Encounter (Signed)
Left message for patient to call back  

## 2017-04-28 NOTE — Telephone Encounter (Signed)
Form for Cpap supplies faxed.

## 2017-04-29 ENCOUNTER — Telehealth: Payer: Self-pay | Admitting: Family Medicine

## 2017-04-29 NOTE — Telephone Encounter (Signed)
Patient calling b/c he hasn't heard anything RE the order put in for mammo. Please advise.

## 2017-04-29 NOTE — Telephone Encounter (Signed)
Order has been entered. What needs to be done to get him scheduled.

## 2017-05-04 NOTE — Telephone Encounter (Signed)
Patient needs a call as soon as possible to figure out when he can get scheduled for the MRI due to continued pains. Call patient before the end of the day with an update.

## 2017-05-07 ENCOUNTER — Telehealth: Payer: Self-pay | Admitting: Family Medicine

## 2017-05-07 NOTE — Telephone Encounter (Signed)
Copied from CRM [phone]. Topic: Quick Communication - See Telephone Encounter >> May 07, 2017  4:16 PM Cipriano BunkerLambe, Annette S wrote: CRM for notification. See Telephone encounter for: Patient called frustrated, has called before with no response. He is to have an MRI or Mammogram done of his breast area and has not heard anything and has no appt yet.  Spoke to [place], and sending this high priority, please call patient  05/07/17.

## 2017-05-08 ENCOUNTER — Other Ambulatory Visit: Payer: Self-pay | Admitting: Family Medicine

## 2017-05-08 DIAGNOSIS — N62 Hypertrophy of breast: Secondary | ICD-10-CM

## 2017-05-08 NOTE — Telephone Encounter (Signed)
Called and spoke with patient- explained that [facility] had been behind but everything was in place now.  Patient stated he would call them to reschedule current appointment to better suiting time. No further action needed.

## 2017-05-08 NOTE — Telephone Encounter (Signed)
Tonya at [facility] changing order for US to 3D MAMMO, and placing additional order for left breast US, will send to provider for cosign.   Appointment scheduled for: 05/13/17 @ 1350, pt arrive at 1330.  Will call patient to inform.

## 2017-05-08 NOTE — Telephone Encounter (Signed)
Mellody DanceKeith, please look into this, needs to be scheduled.

## 2017-05-13 ENCOUNTER — Ambulatory Visit: Payer: Medicare Other

## 2017-05-13 ENCOUNTER — Ambulatory Visit
Admission: RE | Admit: 2017-05-13 | Discharge: 2017-05-13 | Disposition: A | Discharge status: Home / Self Care | Payer: Medicare Other | Source: Ambulatory Visit | Attending: Family Medicine | Admitting: Family Medicine

## 2017-05-13 DIAGNOSIS — N62 Hypertrophy of breast: Secondary | ICD-10-CM

## 2017-05-13 DIAGNOSIS — R928 Other abnormal and inconclusive findings on diagnostic imaging of breast: Secondary | ICD-10-CM | POA: Diagnosis not present

## 2017-05-14 NOTE — Progress Notes (Signed)
Patient's mammogram with benign breast tissue.  No further treatment is required at this time, however if this is a troublesome issue for him, he can schedule a follow-up appointment with me to discuss further management.

## 2017-05-21 ENCOUNTER — Telehealth: Payer: Self-pay | Admitting: Family Medicine

## 2017-05-21 MED ORDER — TRIAMCINOLONE ACETONIDE 0.1 % EX CREA: 1.0000 "application " | TOPICAL_CREAM | Freq: Two times a day (BID) | CUTANEOUS | 0 refills | Status: DC

## 2017-05-21 NOTE — Telephone Encounter (Signed)
Triamcinolone sent in.  If not improving he will need a follow-up visit.  Katina Degreealeb M. Jimmey RalphParker, MD 05/21/2017 5:06 PM

## 2017-05-21 NOTE — Telephone Encounter (Signed)
Copied from CRM #[id number]. Topic: Quick Communication - See Telephone Encounter >> May 21, 2017  9:08 AM Floria RavelingStovall, Shana A wrote: CRM for notification. See Telephone encounter for:  pt called in and said that he has eczema on his ankles.  He said that he knows that the Triamcinolone cream helps and would like to know if this cream could be called in   Pharmacy - [facility]   05/21/17.

## 2017-05-25 ENCOUNTER — Encounter: Payer: Self-pay | Admitting: Physical Therapy

## 2017-05-29 ENCOUNTER — Telehealth: Payer: Self-pay

## 2017-05-29 DIAGNOSIS — D509 Iron deficiency anemia, unspecified: Secondary | ICD-10-CM

## 2017-05-29 NOTE — Telephone Encounter (Signed)
-----   Message from Jeanine LuzLeslie K Machai Desmith, CMA sent at 04/01/2017  3:20 PM EDT ----- Patient needs cbc and ferritin 05/31/2017

## 2017-05-29 NOTE — Telephone Encounter (Signed)
Spoke with patient and told him he was due for some follow up labs.  Patient agreed to come next week.

## 2017-06-10 ENCOUNTER — Telehealth: Payer: Self-pay | Admitting: Internal Medicine

## 2017-06-10 NOTE — Telephone Encounter (Signed)
Left message for pt to call back.  Pt states that since Christmas he has been having problems with abdominal pain in his LLQ and some pain in his lower back on the left side. States he has been having issues with nausea, vomiting, and diarrhea, reports no fever. Pt requesting to be seen. Pt scheduled to see Doug SouJessica Zehr PA tomorrow at 9:30am. Pt aware of appt.

## 2017-06-11 ENCOUNTER — Other Ambulatory Visit: Payer: Self-pay

## 2017-06-11 ENCOUNTER — Other Ambulatory Visit (INDEPENDENT_AMBULATORY_CARE_PROVIDER_SITE_OTHER): Payer: Medicare Other

## 2017-06-11 ENCOUNTER — Encounter: Payer: Self-pay | Admitting: Gastroenterology

## 2017-06-11 ENCOUNTER — Ambulatory Visit (INDEPENDENT_AMBULATORY_CARE_PROVIDER_SITE_OTHER): Payer: Medicare Other | Admitting: Gastroenterology

## 2017-06-11 VITALS — BP 124/72 | HR 74 | Ht 68.0 in | Wt 233.0 lb

## 2017-06-11 DIAGNOSIS — R112 Nausea with vomiting, unspecified: Secondary | ICD-10-CM | POA: Diagnosis not present

## 2017-06-11 DIAGNOSIS — R1032 Left lower quadrant pain: Secondary | ICD-10-CM

## 2017-06-11 DIAGNOSIS — D509 Iron deficiency anemia, unspecified: Secondary | ICD-10-CM

## 2017-06-11 DIAGNOSIS — R110 Nausea: Secondary | ICD-10-CM | POA: Diagnosis not present

## 2017-06-11 LAB — FERRITIN: Ferritin: 68.8 ng/mL (ref 22.0–322.0)

## 2017-06-11 LAB — CBC WITH DIFFERENTIAL/PLATELET
Basophils Absolute: 0.1 10*3/uL (ref 0.0–0.1)
Basophils Relative: 1.2 % (ref 0.0–3.0)
EOS PCT: 1.7 % (ref 0.0–5.0)
Eosinophils Absolute: 0.1 10*3/uL (ref 0.0–0.7)
HCT: 41.7 % (ref 39.0–52.0)
HEMOGLOBIN: 14 g/dL (ref 13.0–17.0)
LYMPHS PCT: 24.8 % (ref 12.0–46.0)
Lymphs Abs: 1.4 10*3/uL (ref 0.7–4.0)
MCHC: 33.6 g/dL (ref 30.0–36.0)
MCV: 90 fl (ref 78.0–100.0)
MONOS PCT: 12.1 % — AB (ref 3.0–12.0)
Monocytes Absolute: 0.7 10*3/uL (ref 0.1–1.0)
Neutro Abs: 3.3 10*3/uL (ref 1.4–7.7)
Neutrophils Relative %: 60.2 % (ref 43.0–77.0)
Platelets: 235 10*3/uL (ref 150.0–400.0)
RBC: 4.64 Mil/uL (ref 4.22–5.81)
RDW: 18.3 % — ABNORMAL HIGH (ref 11.5–15.5)
WBC: 5.5 10*3/uL (ref 4.0–10.5)

## 2017-06-11 LAB — BASIC METABOLIC PANEL
BUN: 12 mg/dL (ref 6–23)
CO2: 30 mEq/L (ref 19–32)
Calcium: 9.4 mg/dL (ref 8.4–10.5)
Chloride: 92 mEq/L — ABNORMAL LOW (ref 96–112)
Creatinine, Ser: 1.15 mg/dL (ref 0.40–1.50)
GFR: 67.65 mL/min (ref >60.00)
Glucose, Bld: 126 mg/dL — ABNORMAL HIGH (ref 70–99)
POTASSIUM: 2.9 meq/L — AB (ref 3.5–5.1)
SODIUM: 135 meq/L (ref 135–145)

## 2017-06-11 MED ORDER — METRONIDAZOLE 500 MG PO TABS: 500.0000 mg | ORAL_TABLET | Freq: Three times a day (TID) | ORAL | 0 refills | Status: DC

## 2017-06-11 MED ORDER — CIPROFLOXACIN HCL 500 MG PO TABS: 500.0000 mg | ORAL_TABLET | Freq: Two times a day (BID) | ORAL | 0 refills | Status: DC

## 2017-06-11 NOTE — Progress Notes (Signed)
06/11/2017 Drinda ButtsMarc Anthony Schiffer [id number] [birth year]   HISTORY OF PRESENT ILLNESS:  This is a 67 year old male who is known to Dr. Marina GoodellPerry.  He presents to our office today with about 9 days of a constellation of symptoms (started on 12/23) including nausea, 2 episodes of vomiting, some loose stools, lower abdominal pain mostly on the left and some in the mid abdomen, dizziness, loss of appetite, weakness, sensitivity to smells.  This all started sudden.  He has had a moderate amount of improvement over the last 2 days, but still does not feel great.  Has been able to eat, but still having a moderate amount of abdominal pain.  Denies any fever.  Normal BM yesterday and today.  Reports a lot of noise and gurgling.  No other family members with similar symptoms.  Colonoscopy 04/2015 in [place] showed mild pandiverticular disease and external hemorrhoids.    Has never been treated for diverticulitis in the past.   Past Medical History:  Diagnosis Date  . Diverticulosis   . Genital warts   . GERD (gastroesophageal reflux disease)   . Hiatal hernia   . History of colon polyps   . History of detached retina repair    bilateral-  x1  left eye 06-17-2013;  x2 right eye 04-26-2011 & 07-04-2010  . Hyperlipidemia   . Hypertension   . IDA (iron deficiency anemia)   . Internal and external prolapsed hemorrhoids   . OSA on CPAP    , sleep study- 2005  . Wears contact lenses    bilateral   Past Surgical History:  Procedure Laterality Date  . CATARACT EXTRACTION W/ INTRAOCULAR LENS IMPLANT Left 04/2013  . COLONOSCOPY WITH ESOPHAGOGASTRODUODENOSCOPY (EGD)  11-11-2013;  04-13-2015  [facility])  . EVALUATION UNDER ANESTHESIA WITH HEMORRHOIDECTOMY N/A 03/18/2017   Procedure: EXAM UNDER ANESTHESIA WITH HEMORRHOIDECTOMY;  Surgeon: Andria MeuseWhite, Christopher M, MD;  [facility];  Service: General;  Laterality: N/A;  . HEMORRHOID BANDING  02-02-2017   dr gessner (office)    . INGUINAL HERNIA REPAIR Left yrs ago  . RETINAL DETACHMENT SURGERY Right 04-25-2010;  recurrent 07-04-2010   dr Ashley [facility])  . SCLERAL BUCKLE Left 06/17/2013   Procedure: SCLERAL BUCKLE WITH ENDOLASER;  Surgeon: Sherrie GeorgeJohn D [name], MD;  [facility];  Service: Ophthalmology;  Laterality: Left;  . SHOULDER ARTHROSCOPY Left 2005  . STRABISMUS SURGERY  06/06/2011   Procedure: REPAIR STRABISMUS;  Surgeon: Shara BlazingWilliam O Young;  Location: [facility];  Service: Ophthalmology;  Laterality: Right;    reports that  has never smoked. he has never used smokeless tobacco. He reports that he drinks alcohol. He reports that he does not use drugs. family history includes Alcohol abuse in his father and mother; Brain cancer in his mother; Breast cancer in his mother; Cancer in his mother; Colon cancer in his father; Colon polyps in his father; Early death in his mother; Hyperlipidemia in his mother; Hypertension in his mother; Liver cancer in his father; Lung cancer in his father and mother; Pancreatic cancer in his father; Ulcerative colitis in his father. Allergies  Allergen Reactions  . Codeine Hives  . Tramadol Other (See Comments)    intolerance      Outpatient Encounter Medications as of 06/11/2017  Medication Sig  . atorvastatin (LIPITOR) 20 MG tablet Take 20 mg by mouth every evening.   . bisoprolol (ZEBETA) 10 MG tablet Take 10 mg by mouth every morning.   . chlorhexidine (HIBICLENS)  4 % external liquid Apply daily as needed topically.  . diclofenac (VOLTAREN) 75 MG EC tablet Take 1 tablet (75 mg total) 2 (two) times daily by mouth. (Patient taking differently: Take 75 mg by mouth 2 (two) times daily. Uses as needed)  . fluticasone (FLONASE) 50 MCG/ACT nasal spray Place 1 spray into both nostrils as needed. Uses as needed  . indomethacin (INDOCIN SR) 75 MG CR capsule Take 75 mg as needed by mouth.  . IRON PO Take 65 mg by mouth.  Marland Kitchen lisinopril-hydrochlorothiazide  (PRINZIDE,ZESTORETIC) 20-25 MG tablet Take 1 tablet by mouth every morning.  . Multiple Vitamin (MULTIVITAMIN) capsule Take 1 capsule by mouth every morning.   Marland Kitchen omeprazole (PRILOSEC) 10 MG capsule Take 20 mg by mouth every morning.   . traZODone (DESYREL) 100 MG tablet Take 100 mg by mouth at bedtime as needed.   . triamcinolone cream (KENALOG) 0.1 % Apply 1 application topically 2 (two) times daily.   No facility-administered encounter medications on file as of 06/11/2017.      REVIEW OF SYSTEMS  : All other systems reviewed and negative except where noted in the History of Present Illness.   PHYSICAL EXAM: BP 124/72   Pulse 74   Ht 5\' 8"  (1.727 m)   Wt 233 lb (105.7 kg)   BMI 35.43 kg/m  General: Well developed white male in no acute distress Head: Normocephalic and atraumatic Eyes:  Sclerae anicteric, conjunctiva pink. Ears: Normal auditory acuity Lungs: Clear throughout to auscultation; no increased WOB Heart: Regular rate and rhythm; no M/R/G. Abdomen: Soft, non-distended.  BS present.  Moderate LLQ TTP. Musculoskeletal: Symmetrical with no gross deformities  Skin: No lesions on visible extremities Extremities: No edema  Neurological: Alert oriented x 4, grossly non-focal Psychological:  Alert and cooperative. Normal mood and affect  ASSESSMENT AND PLAN: *67 year old male with about 9 days of a constellation of symptoms including nausea, 2 episodes of vomiting, some loose stools, lower abdominal pain mostly on the left and some in the mid abdomen, dizziness, loss of appetite, weakness, sensitivity to smells: Once again this started about 11 days ago, but he has had a moderate amount of improvement over the last 2 days, but still does not feel great.  I think that this very well could have been something viral, but he did have moderate left lower quadrant tenderness on exam and does have diverticulosis documented on previous colonoscopy.  I am going to treat him with 7-day course  of Cipro 500 mg twice daily and Flagyl 500 mg 3 times daily for possible diverticulitis.  He is due for a CBC and a ferritin level today so I will just add a BMP should that as well.  He will call back next week with an update on his symptoms.   CC:  Ardith Dark, MD

## 2017-06-11 NOTE — Patient Instructions (Signed)
If you are age 66 or older, your body mass index should be between 23-30. Your Body mass index is 35.43 kg/m. If this is out of the aforementioned range listed, please consider follow up with your Primary Care Provider.  If you are age 66 or younger, your body mass index should be between 19-25. Your Body mass index is 35.43 kg/m. If this is out of the aformentioned range listed, please consider follow up with your Primary Care Provider.   We have sent the following medications to your pharmacy for you to pick up at your convenience:  Cipro  Flagyl  Your physician has requested that you go to the basement for the following lab work before leaving today:  BMET  Thank you.

## 2017-06-12 NOTE — Progress Notes (Signed)
Assessment and plans revealed

## 2017-06-17 ENCOUNTER — Other Ambulatory Visit: Payer: Self-pay | Admitting: Family Medicine

## 2017-06-18 ENCOUNTER — Encounter: Payer: Self-pay | Admitting: Family Medicine

## 2017-06-18 ENCOUNTER — Ambulatory Visit (INDEPENDENT_AMBULATORY_CARE_PROVIDER_SITE_OTHER): Payer: Medicare Other | Admitting: Family Medicine

## 2017-06-18 DIAGNOSIS — N62 Hypertrophy of breast: Secondary | ICD-10-CM

## 2017-06-18 DIAGNOSIS — E876 Hypokalemia: Secondary | ICD-10-CM | POA: Diagnosis not present

## 2017-06-18 DIAGNOSIS — I10 Essential (primary) hypertension: Secondary | ICD-10-CM

## 2017-06-18 DIAGNOSIS — Z9989 Dependence on other enabling machines and devices: Secondary | ICD-10-CM | POA: Diagnosis not present

## 2017-06-18 DIAGNOSIS — G4733 Obstructive sleep apnea (adult) (pediatric): Secondary | ICD-10-CM

## 2017-06-18 NOTE — Assessment & Plan Note (Signed)
Patient reports compliance to CPAP machine.  Also reports that he has significant benefit from its use.

## 2017-06-18 NOTE — Assessment & Plan Note (Signed)
At goal today, however we will need to stop his lisinopril-HCTZ due to hypokalemia.  Follow-up in 2 weeks.  Advised continue home blood pressure monitoring of 140/90 or lower.

## 2017-06-18 NOTE — Assessment & Plan Note (Signed)
Patient had recent GI illness which could have contributed however had hypokalemia on lab work from 3 months ago as well.  His low potassium level could be causing some of his symptoms of dizziness and muscle spasms.  We will stop his lisinopril and HCTZ.  He will follow-up in 1-2 weeks to repeat potassium level and see if his symptoms are improving.  If remains hypokalemic, will need to start replacement as well as look at other etiologies including hyperaldosteronism, RTA, etc.

## 2017-06-18 NOTE — Patient Instructions (Signed)
Your low potassium is likely causing your symptoms.   We should stop your lisinopril-HCTZ.  Unfortunately, there are no FDA approved treatments for gynecomastia. Weight loss and surgery are the mainstays of treatment.  Your goal blood pressure is 140/90 or less. Please let me know if it is persistently elevated above this.  Come back to see me in 2 weeks for lab work and blood pressure check.  Take care, Dr Jimmey RalphParker

## 2017-06-18 NOTE — Assessment & Plan Note (Signed)
Reassured patient that his workup was benign.  Informed him that there were no FDA approved treatments for gynecomastia.  Advised him that he should work on weight loss and consider surgical referral if becomes problematic.  Patient declined surgical referral today.  Continue with watchful waiting.

## 2017-06-18 NOTE — Progress Notes (Signed)
    Subjective:  Shane ButtsMarc Anthony Lean is a 67 y.o. male who presents today with a chief complaint of OSA on CPAP follow up.   HPI:  OSA on CPAP, Established Problem, stable Patient reports compliance with his CPAP machine.  He wears this every night.  Has significant benefit from his CPAP.  Much more alert and awake for during the day.  Does not doze off.  Gynecomastia, established problem, stable Occasional left nipple soreness and itching.  Patient would like to know if there are any treatment options for this.  Hypokalemia, new problem Incidentally noticed on last few CMETs.  Per patient, this has not been an issue for him in the past.    Patient notes that he had a GI illness about 2 weeks ago including several episodes of diarrhea and vomiting.  Since then his GI symptoms have resolved, however he still has other vague symptoms including dizziness, muscle spasms, and back pain.  Hypertension, established problem, Stable BP Readings from Last 3 Encounters:  06/18/17 124/80  06/11/17 124/72  04/20/17 120/80  Currently on bisoprolol 10 mg daily as well as lisinopril-HCTZ 20-25 daily.  Tolerating this well without any apparent side effects.  ROS: Denies any chest pain, shortness of breath, dyspnea on exertion, leg edema.   PMH: He reports that  has never smoked. he has never used smokeless tobacco. He reports that he drinks alcohol. He reports that he does not use drugs.  Objective:  Physical Exam: BP 124/80 (BP Location: Left Arm, Patient Position: Sitting, Cuff Size: Normal)   Pulse 70   Temp 98.4 F (36.9 C) (Oral)   Ht 5\' 8"  (1.727 m)   Wt 237 lb 9.6 oz (107.8 kg)   SpO2 96%   BMI 36.13 kg/m   Gen: NAD, resting comfortably CV: RRR with no murmurs appreciated Pulm: NWOB, CTAB with no crackles, wheezes, or rhonchi Neuro: CN2-12 intact. Moves all extremities.   Assessment/Plan:  OSA on CPAP Patient reports compliance to CPAP machine.  Also reports that he has  significant benefit from its use.    Gynecomastia Reassured patient that his workup was benign.  Informed him that there were no FDA approved treatments for gynecomastia.  Advised him that he should work on weight loss and consider surgical referral if becomes problematic.  Patient declined surgical referral today.  Continue with watchful waiting.  Hypokalemia Patient had recent GI illness which could have contributed however had hypokalemia on lab work from 3 months ago as well.  His low potassium level could be causing some of his symptoms of dizziness and muscle spasms.  We will stop his lisinopril and HCTZ.  He will follow-up in 1-2 weeks to repeat potassium level and see if his symptoms are improving.  If remains hypokalemic, will need to start replacement as well as look at other etiologies including hyperaldosteronism, RTA, etc.  Hypertension At goal today, however we will need to stop his lisinopril-HCTZ due to hypokalemia.  Follow-up in 2 weeks.  Advised continue home blood pressure monitoring of 140/90 or lower.   Katina Degreealeb M. Jimmey RalphParker, MD 06/18/2017 12:44 PM

## 2017-07-02 ENCOUNTER — Encounter: Payer: Self-pay | Admitting: Family Medicine

## 2017-07-02 ENCOUNTER — Ambulatory Visit (INDEPENDENT_AMBULATORY_CARE_PROVIDER_SITE_OTHER): Payer: Medicare Other | Admitting: Family Medicine

## 2017-07-02 VITALS — BP 148/88 | HR 64 | Temp 98.4°F | Ht 68.0 in | Wt 239.4 lb

## 2017-07-02 DIAGNOSIS — E876 Hypokalemia: Secondary | ICD-10-CM | POA: Diagnosis not present

## 2017-07-02 DIAGNOSIS — R109 Unspecified abdominal pain: Secondary | ICD-10-CM

## 2017-07-02 DIAGNOSIS — I10 Essential (primary) hypertension: Secondary | ICD-10-CM

## 2017-07-02 LAB — BASIC METABOLIC PANEL
BUN: 8 mg/dL (ref 6–23)
CALCIUM: 8.7 mg/dL (ref 8.4–10.5)
CO2: 30 mEq/L (ref 19–32)
CREATININE: 0.87 mg/dL (ref 0.40–1.50)
Chloride: 102 mEq/L (ref 96–112)
GFR: 93.33 mL/min (ref >60.00)
GLUCOSE: 116 mg/dL — AB (ref 70–99)
Potassium: 3.4 mEq/L — ABNORMAL LOW (ref 3.5–5.1)
Sodium: 141 mEq/L (ref 135–145)

## 2017-07-02 LAB — POCT URINALYSIS DIP (MANUAL ENTRY)
BILIRUBIN UA: NEGATIVE
BILIRUBIN UA: NEGATIVE mg/dL
Glucose, UA: NEGATIVE mg/dL
Leukocytes, UA: NEGATIVE
Nitrite, UA: NEGATIVE
PH UA: 6.5 (ref 5.0–8.0)
Protein Ur, POC: NEGATIVE mg/dL
RBC UA: NEGATIVE
SPEC GRAV UA: 1.01 (ref 1.010–1.025)
Urobilinogen, UA: 0.2 E.U./dL

## 2017-07-02 MED ORDER — AMLODIPINE BESYLATE 10 MG PO TABS: 10.0000 mg | ORAL_TABLET | Freq: Every day | ORAL | 3 refills | Status: DC

## 2017-07-02 MED ORDER — PREDNISONE 50 MG PO TABS: ORAL_TABLET | ORAL | 0 refills | Status: DC

## 2017-07-02 NOTE — Assessment & Plan Note (Signed)
Repeat BMET today.  

## 2017-07-02 NOTE — Patient Instructions (Signed)
Your back pain is likely related to muscular strain or a mild pinched nerve.  Please start the prednisone.  This is not related to your heart.  We will start amlodipine 10 mg daily.  Please keep an eye on your blood pressures. Let me know if your blood pressure is 140/90 or higher.   If your blood pressure does well, please come back to see me in about 6 months.  If not, please come back to see me in about a month.  We will have your blood work back within a couple of business days.  Take care, Dr. Jimmey RalphParker

## 2017-07-02 NOTE — Progress Notes (Signed)
   Subjective:  Shane Lynch is a 67 y.o. male who presents today with a chief complaint of HTN follow up.   HPI:  Hypertension, established problem, Uncontrolled BP Readings from Last 3 Encounters:  07/02/17 (!) 148/88  06/18/17 124/80  06/11/17 124/72   Home BP monitoring: 120s over 80s-150s over 80s Current Medications: Bisoprolol 10 mg daily, compliant without side effects. Interim History: Patient seen about 2 weeks ago.  At that time he stopped his lisinopril-HCTZ due to hypokalemia.  ROS: Denies any chest pain, shortness of breath, dyspnea on exertion, leg edema.   Left flank pain, new issue Started about a month ago.  Stable over that time.  Pain is intermittent nature.  Pain is worsened with movement.  No hematuria.  No dysuria.  Has tried taking Voltaren which has not significantly seemed to help.  Pain is described as a sharp, catching pain.  No obvious precipitating events.  No obvious alleviating or aggravating factors.  ROS: Per HPI  PMH: He reports that  has never smoked. he has never used smokeless tobacco. He reports that he drinks alcohol. He reports that he does not use drugs.   Objective:  Physical Exam: BP (!) 148/88 (BP Location: Left Arm, Patient Position: Sitting, Cuff Size: Normal)   Pulse 64   Temp 98.4 F (36.9 C) (Oral)   Ht 5\' 8"  (1.727 m)   Wt 239 lb 6.4 oz (108.6 kg)   SpO2 96%   BMI 36.40 kg/m   Gen: NAD, resting comfortably CV: RRR with no murmurs appreciated Pulm: NWOB, CTAB with no crackles, wheezes, or rhonchi MSK: -Back: No deformities.  Mildly tender to palpation along left paraspinal muscles.  Full range of motion.  No CVA tenderness.  Results for orders placed or performed in visit on 07/02/17 (from the past 24 hour(s))  POCT urinalysis dipstick     Status: Normal   Collection Time: 07/02/17 10:32 AM  Result Value Ref Range   Color, UA yellow yellow   Clarity, UA clear clear   Glucose, UA negative negative mg/dL   Bilirubin, UA negative negative   Ketones, POC UA negative negative mg/dL   Spec Grav, UA 1.6101.010 [id number] - 1.025   Blood, UA negative negative   pH, UA 6.5 5.0 - 8.0   Protein Ur, POC negative negative mg/dL   Urobilinogen, UA 0.2 0.2 or 1.0 E.U./dL   Nitrite, UA Negative Negative   Leukocytes, UA Negative Negative   Assessment/Plan:  Hypertension Start amlodipine 10 mg daily.  Continue bisoprolol 10 mg daily.  Advised continue home blood pressure monitoring with goal 140/90 or lower.  Follow-up in 1 month, or 6 months if blood pressures at goal at home.  Hypokalemia Repeat BMET today.  Left flank pain UA negative.  No red flag signs or symptoms.  Likely secondary to muscular strain.  He is already on Voltaren which is not significant seem to help.  We will start a prednisone burst-50 mg daily for 5 days.  Discussed warning signs and reasons to return to care.  Follow-up as needed.  Katina Degreealeb M. Jimmey RalphParker, MD 07/02/2017 10:48 AM

## 2017-07-02 NOTE — Assessment & Plan Note (Signed)
Start amlodipine 10 mg daily.  Continue bisoprolol 10 mg daily.  Advised continue home blood pressure monitoring with goal 140/90 or lower.  Follow-up in 1 month, or 6 months if blood pressures at goal at home.

## 2017-07-07 ENCOUNTER — Telehealth: Payer: Self-pay | Admitting: *Deleted

## 2017-07-07 NOTE — Telephone Encounter (Signed)
Can you see how his blood pressures have been running?   The symptoms may improve over the course of a week or two. If they are intolerable, he can cut back to half a pill to see if that helps.  Katina Degreealeb M. Jimmey RalphParker, MD 07/07/2017 12:28 PM

## 2017-07-07 NOTE — Telephone Encounter (Signed)
Please advise 

## 2017-07-07 NOTE — Telephone Encounter (Signed)
Copied from CRM [id number]. Topic: General - Other >> Jul 07, 2017  8:12 AM Percival SpanishKennedy, Cheryl W wrote:  Pt call to say he was told to call and let the doctor no how he was doing on the new med, he call to say he is having some swelling in feet,legs,arms   would like a call back   [phone]

## 2017-07-08 NOTE — Telephone Encounter (Signed)
Spoke with patient.  States his blood pressure has been elevated, but he stopped taking the amlodipine on Sunday.  States his feet, ankles, legs, hands, and wrists swelled up so much that is was painful to walk and to move.    He has finished the prednisone.  This morning (07/08/2017) he took his BP and it was 152/80.  States the swelling has gone down some, but has not resolved completely.  He has only taken bisoprolol this morning.  I suggested that he try taking 1/2 amlodipine, but patient states he does not want to do that.  I advised that it can take some time for his body to adjust to the amlodipine and also told him that the prednisone burst could have been contributing to the swelling he was experiencing.  He still was against resuming amlodipine, even at 1/2 dose.  He would like to know if he should resume lisinopril or if there is another medication he can try.  Please advise.  Patient is aware he may not receive an answer until tomorrow.

## 2017-07-08 NOTE — Telephone Encounter (Signed)
We can restart lisinopril 10mg  daily. Need to keep a close eye on his potassium. Would like for him to come back in 2-3 weeks for blood work to make sure his potassium isn't dropping again.  It is ok for him to stop the amlodipine.

## 2017-07-09 ENCOUNTER — Other Ambulatory Visit: Payer: Self-pay

## 2017-07-09 DIAGNOSIS — E876 Hypokalemia: Secondary | ICD-10-CM

## 2017-07-09 NOTE — Telephone Encounter (Signed)
Patient notified and verbalized understanding of the plan going forward.

## 2017-07-09 NOTE — Telephone Encounter (Signed)
Future order for BMP placed.

## 2017-07-27 ENCOUNTER — Telehealth: Payer: Self-pay | Admitting: Family Medicine

## 2017-07-27 NOTE — Telephone Encounter (Signed)
See Note

## 2017-07-27 NOTE — Telephone Encounter (Signed)
Copied from CRM [phone]. Topic: Quick Communication - Rx Refill/Question >> Jul 27, 2017  3:03 PM Maia Pettiesrtiz, Kristie S wrote: Medication: bisoprolol (ZEBETA) 10 MG tablet is on national backorder - pt has 2 pills left - he states the pharmacy requested new RX for different/equivalent med Has the patient contacted their pharmacy? Yes.   Preferred Pharmacy (with phone number or street name): [id number] - [place], [place] - [address] [phone] (Phone) [phone] (Fax)  Pt requested call from nurse regarding BP and pulse rates - pulse is usually 100+ when he wakes up and feels like his heart is beating hard. It does go down after he is awake and moving to around mid 70s-mid 80s.

## 2017-07-27 NOTE — Telephone Encounter (Signed)
Please advise 

## 2017-07-27 NOTE — Telephone Encounter (Signed)
Please send in metoprolol succinate 100mg  once daily.   Katina Degreealeb M. Jimmey RalphParker, MD 07/27/2017 4:52 PM

## 2017-07-28 ENCOUNTER — Other Ambulatory Visit: Payer: Self-pay

## 2017-07-28 MED ORDER — METOPROLOL SUCCINATE 100 MG PO CS24: 1.0000 | EXTENDED_RELEASE_CAPSULE | Freq: Every day | ORAL | 5 refills | Status: DC

## 2017-07-28 MED ORDER — METOPROLOL SUCCINATE ER 100 MG PO TB24: 100.0000 mg | ORAL_TABLET | Freq: Every day | ORAL | 3 refills | Status: DC

## 2017-07-28 NOTE — Telephone Encounter (Signed)
Rx sent to pharmacy.  Patient has appointment on 07/29/2017.

## 2017-07-29 ENCOUNTER — Ambulatory Visit: Payer: Medicare Other | Admitting: Family Medicine

## 2017-07-29 ENCOUNTER — Encounter: Payer: Self-pay | Admitting: Family Medicine

## 2017-08-03 ENCOUNTER — Telehealth: Payer: Self-pay | Admitting: *Deleted

## 2017-08-03 NOTE — Telephone Encounter (Signed)
Per [employer]:  Caller is having high blood pressure an low pulse. BP is 178/75 pulse is 57. He is having heart palpitations. His BP medication was changed 3 days ago. Takes BP medication in am. Was put on metoprolol 100 mg once a day. Took at 8:30. Heart rate is now 99.

## 2017-08-03 NOTE — Telephone Encounter (Signed)
I called patient he states that he has been asymptomatic since yesterday. He states his BP this morning was 149/79 HR 81 and this afternoon was 125/75 HR 88. He states that he had a chest cold for a several weeks so he is not sure if the episode on Saturday was related to that. He states that he will call back if any symptoms start back. Patient has an appointment scheduled for 08/07/17.

## 2017-08-04 ENCOUNTER — Telehealth: Payer: Self-pay | Admitting: Family Medicine

## 2017-08-04 ENCOUNTER — Ambulatory Visit: Payer: Self-pay | Admitting: *Deleted

## 2017-08-04 ENCOUNTER — Ambulatory Visit (INDEPENDENT_AMBULATORY_CARE_PROVIDER_SITE_OTHER): Payer: Medicare Other | Admitting: Family Medicine

## 2017-08-04 ENCOUNTER — Encounter: Payer: Self-pay | Admitting: Family Medicine

## 2017-08-04 ENCOUNTER — Telehealth: Payer: Self-pay

## 2017-08-04 ENCOUNTER — Other Ambulatory Visit: Payer: Self-pay | Admitting: Family Medicine

## 2017-08-04 ENCOUNTER — Other Ambulatory Visit: Payer: Self-pay

## 2017-08-04 VITALS — BP 146/86 | HR 90 | Temp 98.0°F | Ht 68.0 in | Wt 220.8 lb

## 2017-08-04 DIAGNOSIS — R079 Chest pain, unspecified: Secondary | ICD-10-CM | POA: Diagnosis not present

## 2017-08-04 DIAGNOSIS — R197 Diarrhea, unspecified: Secondary | ICD-10-CM

## 2017-08-04 DIAGNOSIS — R05 Cough: Secondary | ICD-10-CM

## 2017-08-04 DIAGNOSIS — L409 Psoriasis, unspecified: Secondary | ICD-10-CM

## 2017-08-04 DIAGNOSIS — E876 Hypokalemia: Secondary | ICD-10-CM

## 2017-08-04 DIAGNOSIS — R634 Abnormal weight loss: Secondary | ICD-10-CM | POA: Diagnosis not present

## 2017-08-04 DIAGNOSIS — I10 Essential (primary) hypertension: Secondary | ICD-10-CM

## 2017-08-04 DIAGNOSIS — R002 Palpitations: Secondary | ICD-10-CM

## 2017-08-04 DIAGNOSIS — R059 Cough, unspecified: Secondary | ICD-10-CM

## 2017-08-04 LAB — BASIC METABOLIC PANEL
BUN: 6 mg/dL (ref 6–23)
CHLORIDE: 89 meq/L — AB (ref 96–112)
CO2: 32 mEq/L (ref 19–32)
Calcium: 9.1 mg/dL (ref 8.4–10.5)
Creatinine, Ser: 0.86 mg/dL (ref 0.40–1.50)
GFR: 94.56 mL/min (ref >60.00)
GLUCOSE: 105 mg/dL — AB (ref 70–99)
POTASSIUM: 2.5 meq/L — AB (ref 3.5–5.1)
Sodium: 135 mEq/L (ref 135–145)

## 2017-08-04 LAB — MAGNESIUM: Magnesium: 1.2 mg/dL — ABNORMAL LOW (ref 1.5–2.5)

## 2017-08-04 MED ORDER — POTASSIUM CHLORIDE CRYS ER 20 MEQ PO TBCR: 20.0000 meq | EXTENDED_RELEASE_TABLET | Freq: Two times a day (BID) | ORAL | 0 refills | Status: DC

## 2017-08-04 MED ORDER — PROMETHAZINE-DM 6.25-15 MG/5ML PO SYRP: 2.5000 mL | ORAL_SOLUTION | Freq: Four times a day (QID) | ORAL | 0 refills | Status: DC | PRN

## 2017-08-04 MED ORDER — BENZONATATE 200 MG PO CAPS: 200.0000 mg | ORAL_CAPSULE | Freq: Two times a day (BID) | ORAL | 0 refills | Status: DC | PRN

## 2017-08-04 MED ORDER — DOXYCYCLINE HYCLATE 100 MG PO TABS: 100.0000 mg | ORAL_TABLET | Freq: Two times a day (BID) | ORAL | 0 refills | Status: DC

## 2017-08-04 MED ORDER — TRIAMCINOLONE ACETONIDE 0.5 % EX OINT: 1.0000 "application " | TOPICAL_OINTMENT | Freq: Two times a day (BID) | CUTANEOUS | 0 refills | Status: AC

## 2017-08-04 MED ORDER — MAGNESIUM CHLORIDE 64 MG PO TBEC: 2.0000 | DELAYED_RELEASE_TABLET | Freq: Every day | ORAL | 0 refills | Status: DC

## 2017-08-04 NOTE — Telephone Encounter (Signed)
Noted  

## 2017-08-04 NOTE — Telephone Encounter (Signed)
PER [employer]  Caller states he is having high blood pressure on low pulse. BP is 178/75 pulse is 57. He is having heart palpitations. BP medication was changed 3 days ago. Takes BP medication in am. Was put on Metoprolol 100 mg once a day. Took at 8:30. Heart rate is now 99.

## 2017-08-04 NOTE — Progress Notes (Signed)
Subjective:  Shane ButtsMarc Anthony Lynch is a 67 y.o. male who presents today for same-day appointment with a chief complaint of palpitations.   HPI:  Palpitations, new problem Symptoms started about 3 days ago.  Patient attributes the start of this due to him recently changing from bisoprolol to metoprolol succinate.  Patient was on bisoprolol for several years, however was on back order at his pharmacy.  Over the past few days, he has noticed a vacuum-like sensation in his chest.  Describes it as a "flip-flop."  Symptoms occur randomly.  He has also noted intermittent chest pain and left shoulder pain over the last few days.  Patient is not sure if symptoms are worse with exertion.  He has had a recent upper respiratory infection for the past 3-4 weeks and is not sure if this is contributing either.  He has had occasional dizziness.  No syncopal episodes.  No weakness or numbness.  He has had some lower extremity swelling which she attributed to being on prednisone and being on amlodipine for his blood pressure last month.  He has since stopped both of those medications with some improvement in his lower extremity swelling.  Still has a little bit of pain to the dorsal aspect of his right foot.  Hypertension, established problem, uncontrolled BP Readings from Last 3 Encounters:  08/04/17 (!) 146/86  07/02/17 (!) 148/88  06/18/17 124/80   Home BP monitoring: 130s-150s over 80s Current Medications: Metoprolol succinate 100mg  daily, compliant without side effects. Interim History:  Patient seen a month ago for this. At that time, was started on amlodipine, which he stopped after a few weeks due to LE swelling. As noted above, his bisoprolol was switched to metoprolol due to a shortage at his pharmacy.  He also restarted his lisinopril-HCTZ a few weeks ago.  Cough, new problem Symptoms started about 4 weeks ago.  Improved for a couple of weeks however has not noticed much change over the last couple  of weeks.  Associated with sinus congestion and rhinorrhea.  He had fevers for the first couple of days.  Also with some diarrhea-see below problem.  He has tried Mucinex which did not centrically seem to help.  Diarrhea, new problem Also started 3-4 weeks ago.  He has not tried anything for this.  He has lost about 20 pounds over the last month.  No abdominal pain.  No hematochezia.  No melena.  Skin Rash, new problem Patient reports prior history of psoriasis.  Has been prescribed triamcinolone 0.1% cream for this.  Has had a recent outbreak on his left leg that has not responded well to his triamcinolone prescription.  ROS: Per HPI  PMH: Never smoker. Family history significant for ulcerative colitis in father.   Objective:  Physical Exam: BP (!) 146/86 (BP Location: Left Arm, Patient Position: Sitting, Cuff Size: Normal)   Pulse 90   Temp 98 F (36.7 C) (Oral)   Ht 5\' 8"  (1.727 m)   Wt 220 lb 12.8 oz (100.2 kg)   SpO2 93%   BMI 33.57 kg/m   Gen: NAD, resting comfortably CV: RRR with no murmurs appreciated Pulm: NWOB, CTAB with no crackles, wheezes, or rhonchi GI: Normal bowel sounds present. Soft, Nontender, Nondistended. MSK: Trace pitting edema to midshin bilaterally. Skin: Approximately 6 cm raised, erythematous patch on left lateral lower extremity with overlying silvery scale. Neuro: Grossly normal, moves all extremities Psych: Normal affect and thought content  EKG: Normal sinus rhythm.  No acute  ischemic changes.  Assessment/Plan:  Palpitations Based on patient's description, these most likely represent PVCs/PACs.  Likely precipitated in setting of upper respiratory tract infection, dehydration, and switching his beta-blocker.  His EKG does not have any signs for acute ischemic changes, however did not see ectopic beats either.  Given his other symptoms including chest pain, left shoulder pain, and dizziness, we will refer him to cardiology for further evaluation.  We  will check BMET and magnesium level today.  Chest Pain Symptoms are atypical.  EKG does not show any acute ischemic changes.  He does have risk factors including obesity, hypertension, male sex, and advancing age.  Given his other symptoms including palpitations, shoulder pain, and dizziness, we will refer him to cardiology for further evaluation.   Upper respiratory infection His lung exam today is clear however given that his symptoms have been persistent for several weeks, we will empirically treat with antibiotic.  Start doxycycline 100 mg twice daily for 7-day course.  We will also give Tessalon to help him with his cough.  Diarrhea/weight loss Unclear etiology.  May be related to above upper respiratory tract infection.  Encouraged good oral hydration.  Hopefully will have some improvement as his URI resolves.  If has persistent diarrhea, will need stool studies and/or GI referral.  Psoriasis Increase strength of triamcinolone to 0.5%.  Use twice daily as needed.  Hypertension Slightly above goal today. May be elevated in setting of acute URI. He has had some low HRs at home. Patient deferred making changes today. Continue metoprolol 100mg  dail and lisinopril-hctz 20-25mg  daily. Continue home blood pressure monitoring.   Time Spent: I spent 45 minutes face-to-face with the patient, with more than half spent on counseling for etiology and management of his palpitations, management of his chest pain, treatment for his upper respiratory infection, treatment for his psoriasis/stress, management of his hypertension, and management/treatment plan for his diarrhea and weight loss.Katina Degree. Jimmey Ralph, MD 08/04/2017 2:54 PM

## 2017-08-04 NOTE — Telephone Encounter (Signed)
CRITICAL VALUE STICKER  CRITICAL VALUE:  Potassium 2.5  RECEIVER (on-site recipient of call):  Shane Lynch to [employer], CMA  DATE & TIME NOTIFIED:  08/04/2017  4:19 pm  MESSENGER (representative from lab):  Henderson CloudShaneequah  MD NOTIFIED: Jimmey RalphParker  TIME OF NOTIFICATION:  4:21 pm

## 2017-08-04 NOTE — Telephone Encounter (Signed)
Sent in alternative.  Katina Degreealeb M. Jimmey RalphParker, MD 08/04/2017 3:58 PM

## 2017-08-04 NOTE — Telephone Encounter (Signed)
Please see result note documentation

## 2017-08-04 NOTE — Telephone Encounter (Signed)
Called patient to get him scheduled for his AWV. He declined at this time until he feels better. But he told me the pharamacy informed him the medication "tessalon" is not covered by his insurance and needs a different medication.

## 2017-08-04 NOTE — Telephone Encounter (Signed)
See note

## 2017-08-04 NOTE — Assessment & Plan Note (Signed)
Slightly above goal today. May be elevated in setting of acute URI. He has had some low HRs at home. Patient deferred making changes today. Continue metoprolol 100mg  dail and lisinopril-hctz 20-25mg  daily. Continue home blood pressure monitoring.

## 2017-08-04 NOTE — Telephone Encounter (Signed)
Pt called with chest pain when his heart does a "flip flop". He states it does not last very long.  He states feels like his heart is skipping a beat.  His b/p 153/94 HR 91. His heart rate keeps jumping around.  Flow (Cassie) at the his provider's office called. Appointment made for today with Dr. Jimmey RalphParker. Home care advice given to him with verbal understanding.  Reason for Disposition . [1] Chest pain lasting <= 5 minutes AND [2] NO chest pain or cardiac symptoms now(Exceptions: pains lasting a few seconds)  Answer Assessment - Initial Assessment Questions 1. LOCATION: "Where does it hurt?"       Left of center 2. RADIATION: "Does the pain go anywhere else?" (e.g., into neck, jaw, arms, back)     Lower left calf and left arm 3. ONSET: "When did the chest pain begin?" (Minutes, hours or days)      Saturday afternoon, but chest started with pain 4. PATTERN "Does the pain come and go, or has it been constant since it started?"  "Does it get worse with exertion?"      Comes and gose 5. DURATION: "How long does it last" (e.g., seconds, minutes, hours)     Not sure 6. SEVERITY: "How bad is the pain?"  (e.g., Scale 1-10; mild, moderate, or severe)    - MILD (1-3): doesn't interfere with normal activities     - MODERATE (4-7): interferes with normal activities or awakens from sleep    - SEVERE (8-10): excruciating pain, unable to do any normal activities       3 now, feels like congestion 7. CARDIAC RISK FACTORS: "Do you have any history of heart problems or risk factors for heart disease?" (e.g., prior heart attack, angina; high blood pressure, diabetes, being overweight, high cholesterol, smoking, or strong family history of heart disease)     High blood pressure, cholesterol, overweight 8. PULMONARY RISK FACTORS: "Do you have any history of lung disease?"  (e.g., blood clots in lung, asthma, emphysema, birth control pills)     no 9. CAUSE: "What do you think is causing the chest pain?"  Not sure 10. OTHER SYMPTOMS: "Do you have any other symptoms?" (e.g., dizziness, nausea, vomiting, sweating, fever, difficulty breathing, cough)       Dizziness, not sure of breath, feels some constriction in throat 11. PREGNANCY: "Is there any chance you are pregnant?" "When was your last menstrual period?"       no  Protocols used: CHEST PAIN-A-AH

## 2017-08-04 NOTE — Assessment & Plan Note (Signed)
Increase strength of triamcinolone to 0.5%.  Use twice daily as needed.

## 2017-08-04 NOTE — Addendum Note (Signed)
Addended by: Ardith DarkPARKER, Hortensia Duffin M on: 08/04/2017 03:58 PM   Modules accepted: Orders

## 2017-08-04 NOTE — Patient Instructions (Signed)
We will check blood work today.  Please start the doxycycline and tessalon.  Please use the triamcinolone for your rash.  I will refer you to cardiology.   No other changes today.  Take care, Dr Jimmey RalphParker

## 2017-08-04 NOTE — Telephone Encounter (Signed)
Unsure when this call originated, but patient was seen in the office today for these symptoms.

## 2017-08-04 NOTE — Telephone Encounter (Signed)
Please advise 

## 2017-08-06 NOTE — Telephone Encounter (Signed)
Pt called in to be advised. Pt says that he started potassium yesterday but hasn't taken it today because last night he had difficulty sleeping. He is now having body spasms and his feet are swollen. Offered to have nurse speak with pt, pt declined stating that [facility].    Please call back to assist further.

## 2017-08-06 NOTE — Telephone Encounter (Signed)
Spoke with patient.  He states that his feet are slightly swollen and are very sensitive and painful to the touch.  He had difficulty sleeping last night due to the pain in his fee as well as pain in his joints.  He would like to know what to do.  Please advise.

## 2017-08-06 NOTE — Telephone Encounter (Signed)
See note as soon as possible.  °

## 2017-08-06 NOTE — Telephone Encounter (Signed)
Pt should continue taking supplements we prescribed. Needs to have levels rechecked today or tomorrow.  If symptoms worsen or if levels are not improving with oral meds, pt may need to go to the ED for IV infusion - we can't do this in the clinic.   Katina Degreealeb M. Jimmey RalphParker, MD 08/06/2017 3:06 PM

## 2017-08-06 NOTE — Telephone Encounter (Signed)
Patient notified and verbalized understanding. 

## 2017-08-07 ENCOUNTER — Telehealth: Payer: Self-pay | Admitting: Family Medicine

## 2017-08-07 ENCOUNTER — Other Ambulatory Visit: Payer: Medicare Other

## 2017-08-07 ENCOUNTER — Ambulatory Visit: Payer: Medicare Other | Admitting: Family Medicine

## 2017-08-07 MED ORDER — MAGNESIUM LACTATE 84 MG (7MEQ) PO TBCR: 168.0000 mg | EXTENDED_RELEASE_TABLET | Freq: Every day | ORAL | 0 refills | Status: DC

## 2017-08-07 NOTE — Telephone Encounter (Signed)
Patient cancelled the appointment he had scheduled for today for this issue listed below. Please contact patient to inquire.

## 2017-08-07 NOTE — Telephone Encounter (Signed)
See note  Copied from CRM [id number]. Topic: General - Other >> Aug 07, 2017  7:35 AM Gerrianne ScalePayne, Angela L wrote: Reason for CRM: patient calling to let his provider know that his feet is still hurting he cant hardly walk

## 2017-08-07 NOTE — Telephone Encounter (Signed)
Sent in alternative.  Would like to get repeat levels ASAP considering we are coming into the weekend.  If symptoms are worsening, pt may need to go to the ED to recheck levels and possibly have IV repletion.   Katina Degreealeb M. Jimmey RalphParker, MD 08/07/2017 12:07 PM

## 2017-08-07 NOTE — Telephone Encounter (Signed)
Spoke with patient.  Advised that it is very important that he continue to take his potassium and magnesium as per Dr. Jimmey Ralph, these deficiencies are likely causing his symptoms.  Patient states he is trying to get his magnesium from the pharmacy and is taking the potassium.  States he will go to ED over the weekend if he continues to have pain.

## 2017-08-07 NOTE — Telephone Encounter (Signed)
Copied from CRM [id number]. Topic: Quick Communication - Rx Refill/Question >> Aug 07, 2017  9:09 AM Maia Pettiesrtiz, Kristie S wrote: Medication: magnesium chloride (SLOW-MAG) 64 MG TBEC SR tablet  - pharmacy advised this med is not covered - please see if there is an alternative  Has the patient contacted their pharmacy? Yes.   Preferred Pharmacy (with phone number or street name): [facility]  Agent: Please be advised that RX refills may take up to 3 business days. We ask that you follow-up with your pharmacy.

## 2017-08-07 NOTE — Telephone Encounter (Signed)
Please advise 

## 2017-08-07 NOTE — Telephone Encounter (Signed)
See note

## 2017-08-11 ENCOUNTER — Other Ambulatory Visit (INDEPENDENT_AMBULATORY_CARE_PROVIDER_SITE_OTHER): Payer: Medicare Other

## 2017-08-11 DIAGNOSIS — E876 Hypokalemia: Secondary | ICD-10-CM

## 2017-08-11 LAB — BASIC METABOLIC PANEL
BUN: 7 mg/dL (ref 6–23)
CO2: 34 mEq/L — ABNORMAL HIGH (ref 19–32)
Calcium: 9.4 mg/dL (ref 8.4–10.5)
Chloride: 97 mEq/L (ref 96–112)
Creatinine, Ser: 0.8 mg/dL (ref 0.40–1.50)
GFR: 102.78 mL/min (ref >60.00)
GLUCOSE: 113 mg/dL — AB (ref 70–99)
Potassium: 3.3 mEq/L — ABNORMAL LOW (ref 3.5–5.1)
Sodium: 140 mEq/L (ref 135–145)

## 2017-08-11 LAB — MAGNESIUM: MAGNESIUM: 1.5 mg/dL (ref 1.5–2.5)

## 2017-08-16 ENCOUNTER — Other Ambulatory Visit: Payer: Self-pay | Admitting: Family Medicine

## 2017-08-27 ENCOUNTER — Encounter: Payer: Self-pay | Admitting: Family Medicine

## 2017-08-27 ENCOUNTER — Ambulatory Visit (INDEPENDENT_AMBULATORY_CARE_PROVIDER_SITE_OTHER): Payer: Medicare Other | Admitting: Family Medicine

## 2017-08-27 VITALS — BP 116/80 | HR 71 | Temp 98.2°F | Ht 69.0 in | Wt 227.0 lb

## 2017-08-27 DIAGNOSIS — I10 Essential (primary) hypertension: Secondary | ICD-10-CM | POA: Diagnosis not present

## 2017-08-27 DIAGNOSIS — R002 Palpitations: Secondary | ICD-10-CM

## 2017-08-27 DIAGNOSIS — E876 Hypokalemia: Secondary | ICD-10-CM | POA: Diagnosis not present

## 2017-08-27 DIAGNOSIS — I8390 Asymptomatic varicose veins of unspecified lower extremity: Secondary | ICD-10-CM

## 2017-08-27 DIAGNOSIS — L409 Psoriasis, unspecified: Secondary | ICD-10-CM | POA: Diagnosis not present

## 2017-08-27 DIAGNOSIS — M7989 Other specified soft tissue disorders: Secondary | ICD-10-CM

## 2017-08-27 LAB — BASIC METABOLIC PANEL
BUN: 7 mg/dL (ref 6–23)
CHLORIDE: 100 meq/L (ref 96–112)
CO2: 22 meq/L (ref 19–32)
Calcium: 10.5 mg/dL (ref 8.4–10.5)
Creatinine, Ser: 0.91 mg/dL (ref 0.40–1.50)
GFR: 88.57 mL/min (ref >60.00)
Glucose, Bld: 118 mg/dL — ABNORMAL HIGH (ref 70–99)
POTASSIUM: 4.3 meq/L (ref 3.5–5.1)
Sodium: 140 mEq/L (ref 135–145)

## 2017-08-27 LAB — MAGNESIUM: MAGNESIUM: 2 mg/dL (ref 1.5–2.5)

## 2017-08-27 MED ORDER — CLOBETASOL PROPIONATE 0.05 % EX CREA: 1.0000 "application " | TOPICAL_CREAM | Freq: Two times a day (BID) | CUTANEOUS | 0 refills | Status: AC

## 2017-08-27 NOTE — Assessment & Plan Note (Signed)
At goal.  Continue metoprolol 100 mg daily.  Check BMET and mag level today.  Electrolytes are normal, would consider restarting lisinopril depending on home blood pressure monitor readings.

## 2017-08-27 NOTE — Progress Notes (Signed)
    Subjective:  Shane Lynch is a 67 y.o. male who presents today with a chief complaint of palpitations follow up.   HPI:  Palpitations/hypokalemia/hypomagnesemia, established problem, stable Patient seen about 3 weeks ago for this.  At that time, his EKG was normal patient was referred to cardiology.  He has not yet followed up with cardiology.  Blood work was also obtained at that time which showed hypokalemia as well as hypomagnesemia.  Patient was advised to stop his lisinopril-HCTZ and he was started on potassium and magnesium supplementation.  He has done all this for the last couple of weeks with significant improvement in his palpitations.  No further episodes of chest pain or palpitations.  He was additionally having some muscle spasms and soreness during his visit 3 weeks ago which has also resolved.  Hypertension, established problem, Stable BP Readings from Last 3 Encounters:  08/27/17 116/80  08/04/17 (!) 146/86  07/02/17 (!) 148/88   Home BP monitoring: 130s-140s over 70s-80s Current Medications: Metoprolol succinate 100 mg daily, compliant without side effects. Interim History: As noted above, patient stopped his lisinopril-HCTZ about 3 weeks ago due to hypokalemia.  ROS: Denies any chest pain, shortness of breath, dyspnea on exertion, leg edema.   Psoriasis, established problem, worsening Patient with patch of psoriasis on his left lower extremity that is not improved with triamcinolone 0.5%.  Requests stronger treatment today.  ROS: Per HPI  PMH: He reports that he has never smoked. He has never used smokeless tobacco. He reports that he drinks alcohol. He reports that he does not use drugs.  Objective:  Physical Exam: BP 116/80   Pulse 71   Temp 98.2 F (36.8 C)   Ht 5\' 9"  (1.753 m)   Wt 227 lb (103 kg)   SpO2 96%   BMI 33.52 kg/m   Gen: NAD, resting comfortably CV: RRR with no murmurs appreciated Pulm: NWOB, CTAB with no crackles, wheezes, or  rhonchi Extremities: 1+ pitting edema to mid tibia bilaterally.  Significant varicosities also noted bilaterally. Skin: Patch of psoriasis on left lateral malleolus.  Assessment/Plan:  Palpitations Symptoms have resolved.  Likely secondary to hypokalemia and hypomagnesemia related to his lisinopril-HCTZ.  Continue metoprolol.  Check BMET with mag level today.  Hypertension At goal.  Continue metoprolol 100 mg daily.  Check BMET and mag level today.  Electrolytes are normal, would consider restarting lisinopril depending on home blood pressure monitor readings.  Psoriasis Start clobetasol for areas of severe inflammation.  Continue triamcinolone for other areas.  If continues to be uncontrolled, will need referral to dermatology.  Varicosities of leg Likely secondary to venous insufficiency.  Reassured patient.  Encouraged conservative measures including leg elevation compression stockings as needed.  Discussed referral to vascular surgery if patient wishes to have further intervention.  Deferred today.  Hypokalemia Check BMET.  Hypomagnesemia Check mag level.  Lower extremity edema Likely secondary to venous insufficiency.  Will check d-dimer to rule out clot.  Katina Degreealeb M. Jimmey RalphParker, MD 08/27/2017 12:35 PM

## 2017-08-27 NOTE — Assessment & Plan Note (Signed)
Symptoms have resolved.  Likely secondary to hypokalemia and hypomagnesemia related to his lisinopril-HCTZ.  Continue metoprolol.  Check BMET with mag level today.

## 2017-08-27 NOTE — Assessment & Plan Note (Signed)
Likely secondary to venous insufficiency.  Reassured patient.  Encouraged conservative measures including leg elevation compression stockings as needed.  Discussed referral to vascular surgery if patient wishes to have further intervention.  Deferred today.

## 2017-08-27 NOTE — Assessment & Plan Note (Signed)
Start clobetasol for areas of severe inflammation.  Continue triamcinolone for other areas.  If continues to be uncontrolled, will need referral to dermatology.

## 2017-08-27 NOTE — Patient Instructions (Signed)
No changes today.  We will check blood work.  Use the clobetasol as needed.   Come back to see me in 6 months, or sooner as needed.  Take care, Dr Jimmey RalphParker

## 2017-08-28 ENCOUNTER — Telehealth: Payer: Self-pay | Admitting: Family Medicine

## 2017-08-28 LAB — D-DIMER, QUANTITATIVE: D-Dimer, Quant: 0.58 mcg/mL FEU — ABNORMAL HIGH (ref <0.50)

## 2017-08-28 NOTE — Telephone Encounter (Signed)
See result note.  

## 2017-08-28 NOTE — Telephone Encounter (Signed)
Copied from CRM [phone]. Topic: Quick Communication - See Telephone Encounter >> Aug 28, 2017  3:49 PM Cipriano BunkerLambe, Annette S wrote: CRM for notification.   He is calling regarding his blood work from 3/21  See Telephone encounter for: 08/28/17.

## 2017-09-10 ENCOUNTER — Other Ambulatory Visit: Payer: Self-pay | Admitting: Family Medicine

## 2017-10-03 ENCOUNTER — Other Ambulatory Visit: Payer: Self-pay | Admitting: Family Medicine

## 2017-10-26 ENCOUNTER — Ambulatory Visit: Payer: Self-pay | Admitting: *Deleted

## 2017-10-26 NOTE — Telephone Encounter (Signed)
FYI: appointment 5/21

## 2017-10-26 NOTE — Telephone Encounter (Signed)
Pt called stating that he is having shakiness intermittently; he states that his BP medication has been changed; his BP this morning was 150/91, pulse 79 at 0950; he says that his BP is all over the place; yesterday it was 163/92 pulse 80's on 5/19 at 1500; the pt states that the MD was talking about put him on lisinopril; the pt states that this has been going on about 3 weeks ago; he also states that he has a lot of stress; the pt would like to see Dr Jacquiline Doe tommorrow after 1300 for evaluation and labs; nurse triage initiated and recommendations made per protocol to include seeing a physician within 3 days; pt offered and accepted appointment with Dr Jimmey Ralph, [facility] 10/27/17 at 1400; he verbalizes understanding; will route to office for notification of this upcoming appointment.   Reason for Disposition . [1] Taking BP medications AND [2] feels is having side effects (e.g., impotence, cough, dizzy upon standing)  Answer Assessment - Initial Assessment Questions 1. BLOOD PRESSURE: "What is the blood pressure?" "Did you take at least two measurements 5 minutes apart?"    150/91 today 0950; 163/92 yesterday at 1500 2. ONSET: "When did you take your blood pressure?"    10/26/17 at 0950 and 5/19//19 at 1500 3. HOW: "How did you obtain the blood pressure?" (e.g., visiting nurse, automatic home BP monitor)  automatic cuff at home; used left upper arm  4. HISTORY: "Do you have a history of high blood pressure?"    yes 5. MEDICATIONS: "Are you taking any medications for blood pressure?" "Have you missed any doses recently?"     Missed no doses of medication 6. OTHER SYMPTOMS: "Do you have any symptoms?" (e.g., headache, chest pain, blurred vision, difficulty breathing, weakness)     Shakiness, ocassional headache (last Wed of last week) 7. PREGNANCY: "Is there any chance you are pregnant?" "When was your last menstrual period?"    n/a  Protocols used: HIGH BLOOD PRESSURE-A-AH

## 2017-10-26 NOTE — Telephone Encounter (Signed)
See note

## 2017-10-27 ENCOUNTER — Ambulatory Visit (INDEPENDENT_AMBULATORY_CARE_PROVIDER_SITE_OTHER): Payer: Medicare Other | Admitting: Family Medicine

## 2017-10-27 ENCOUNTER — Encounter: Payer: Self-pay | Admitting: Family Medicine

## 2017-10-27 VITALS — BP 148/92 | HR 68 | Temp 98.5°F | Resp 18 | Ht 69.0 in | Wt 232.0 lb

## 2017-10-27 DIAGNOSIS — R739 Hyperglycemia, unspecified: Secondary | ICD-10-CM | POA: Diagnosis not present

## 2017-10-27 DIAGNOSIS — I10 Essential (primary) hypertension: Secondary | ICD-10-CM | POA: Diagnosis not present

## 2017-10-27 DIAGNOSIS — R002 Palpitations: Secondary | ICD-10-CM | POA: Diagnosis not present

## 2017-10-27 MED ORDER — LOSARTAN POTASSIUM 50 MG PO TABS: 50.0000 mg | ORAL_TABLET | Freq: Every day | ORAL | 3 refills | Status: AC

## 2017-10-27 NOTE — Assessment & Plan Note (Signed)
Patient above goal today.  He is very anxious about his blood pressures.  We will start losartan 50 mg daily in addition to continuing his metoprolol succinate 100 mg daily.  We will also check BMET today.  Discussed plan of care with patient.  If blood pressure not at goal on above regimen, will increase losartan to 100 mg daily.  Still not at goal, would consider addition to hydralazine to his regimen as he has not tolerated thiazides or calcium channel blockers in the past.

## 2017-10-27 NOTE — Progress Notes (Signed)
    Subjective:  Shane Lynch is a 67 y.o. male who presents today with a chief complaint of hypertension.   HPI:  Hypertension, chronic problem, uncontrolled Patient seen about 2 months ago for this.  That time blood pressure was at goal.  We continued him on metoprolol 100 mg daily.  He has noticed extremely labile blood pressures for the last several weeks.  He was previously on bisoprolol and lisinopril-HCTZ blood pressure at goal.  He has had occasional palpitations that have persisted since his last visit.  Occasional pain in his neck.  Also notes increased stress due to moving to [place] soon and dealing with custody of his grandchild.   ROS: Per HPI  PMH: He reports that he has never smoked. He has never used smokeless tobacco. He reports that he drinks alcohol. He reports that he does not use drugs.   Objective:  Physical Exam: BP (!) 148/92   Pulse 68   Temp 98.5 F (36.9 C) (Oral)   Resp 18   Ht  (1.753 m)   Wt 232 lb (105.2 kg)   SpO2 96%   BMI 34.26 kg/m   Gen: NAD, resting comfortably CV: RRR with no murmurs appreciated. No carotid bruits.  Pulm: NWOB, CTAB with no crackles, wheezes, or rhonchi GI: Normal bowel sounds present. Soft, Nontender, Nondistended. MSK: No edema, cyanosis, or clubbing noted Skin: Warm, dry Neuro: Grossly normal, moves all extremities Psych: Normal affect and thought content  Assessment/Plan:  Hypertension Patient above goal today.  He is very anxious about his blood pressures.  We will start losartan 50 mg daily in addition to continuing his metoprolol succinate 100 mg daily.  We will also check BMET today.  Discussed plan of care with patient.  If blood pressure not at goal on above regimen, will increase losartan to 100 mg daily.  Still not at goal, would consider addition to hydralazine to his regimen as he has not tolerated thiazides or calcium channel blockers in the past.  Palpitations Patient has not been able to  be seen by cardiology yet.  Will place order for Holter monitor to further evaluate for the time being.  Check TSH, BMET, and CBC.  Hyperglycemia Check BMET and A1c today.  Time Spent: I spent >25 minutes face-to-face with the patient, with more than half spent on counseling for management plan for his hypertension and palpitations.  Katina Degree. Jimmey Ralph, MD 10/27/2017 3:56 PM

## 2017-10-27 NOTE — Assessment & Plan Note (Addendum)
Patient has not been able to be seen by cardiology yet.  Will place order for Holter monitor to further evaluate for the time being.  Check TSH, BMET, and CBC.

## 2017-10-27 NOTE — Patient Instructions (Addendum)
It was very nice to see you today.  We will start losartan  daily.  We will check blood work today and set you up for a holter monitor.  Next steps if your blood pressure is not controlled: 1. Increase dose of losartan to  daily 2. Start hydralazine and increase dose as needed  Please come back in a few weeks to repeat blood work.  Take care, Dr Jimmey Ralph

## 2017-10-28 LAB — BASIC METABOLIC PANEL
BUN: 8 mg/dL (ref 6–23)
CALCIUM: 9.3 mg/dL (ref 8.4–10.5)
CO2: 25 mEq/L (ref 19–32)
Chloride: 104 mEq/L (ref 96–112)
Creatinine, Ser: 0.81 mg/dL (ref 0.40–1.50)
GFR: 101.26 mL/min (ref >60.00)
Glucose, Bld: 94 mg/dL (ref 70–99)
Potassium: 3.8 mEq/L (ref 3.5–5.1)
SODIUM: 141 meq/L (ref 135–145)

## 2017-10-28 LAB — CBC
HCT: 40.9 % (ref 39.0–52.0)
Hemoglobin: 13.6 g/dL (ref 13.0–17.0)
MCHC: 33.4 g/dL (ref 30.0–36.0)
MCV: 92.3 fl (ref 78.0–100.0)
Platelets: 217 10*3/uL (ref 150.0–400.0)
RBC: 4.43 Mil/uL (ref 4.22–5.81)
RDW: 15.1 % (ref 11.5–15.5)
WBC: 4.2 10*3/uL (ref 4.0–10.5)

## 2017-10-28 LAB — TSH: TSH: 1.54 u[IU]/mL (ref 0.35–4.50)

## 2017-10-28 LAB — HEMOGLOBIN A1C: HEMOGLOBIN A1C: 5.9 % (ref 4.6–6.5)

## 2017-10-29 NOTE — Progress Notes (Signed)
Dr Lavone Neri interpretation of your lab work:  Your tests are all NORMAL. We do not need to make any changes to your medications or do any further testing at this time.    If you have any additional questions, please give [place] a call or send [place] a message through [place].  Take care, Dr Jimmey Ralph

## 2017-11-09 ENCOUNTER — Ambulatory Visit: Payer: Self-pay | Admitting: *Deleted

## 2017-11-09 NOTE — Telephone Encounter (Signed)
See note

## 2017-11-09 NOTE — Telephone Encounter (Signed)
FYI

## 2017-11-09 NOTE — Telephone Encounter (Signed)
  Called c/o shaking, BP remains elevated and feet are swelling on and off since BP medications were changed around.   He was changed to Metoprolol and Losartan with his last visit with Dr. Jimmey RalphParker.  He mentioned he is moving to [place] next week for retirement.   He wants to get this BP thing straightened out before moving if possible.  I made him an appt for 11/10/17 1:40 with Dr. Jimmey RalphParker.   Reason for Disposition . Caller has URGENT medication question about med that PCP prescribed and triager unable to answer question  Answer Assessment - Initial Assessment Questions 1. SYMPTOMS: "Do you have any symptoms?"     Metoprolol and Losartan added.   My BP     170/94  P-92.    I'm also shaking.   My normal 146/70-80.   Since switched medication my BP has not been as low.   Shaking since saw Dr. Jimmey RalphParker.   2. SEVERITY: If symptoms are present, ask "Are they mild, moderate or severe?"     It's not good to have high BP.    My feet swell on and off.   They were swollen when I saw Dr. Jimmey RalphParker.     I'm moving next week to  [place].  I want to get straightened out before I move.  Never been diagnosed with gout.  Protocols used: MEDICATION QUESTION CALL-A-AH

## 2017-11-09 NOTE — Telephone Encounter (Signed)
Please ask pt to increase losartan to 100mg  daily.  Katina Degreealeb M. Jimmey RalphParker, MD 11/09/2017 1:27 PM

## 2017-11-10 ENCOUNTER — Ambulatory Visit (INDEPENDENT_AMBULATORY_CARE_PROVIDER_SITE_OTHER): Payer: Medicare Other | Admitting: Family Medicine

## 2017-11-10 ENCOUNTER — Encounter: Payer: Self-pay | Admitting: Family Medicine

## 2017-11-10 VITALS — BP 152/88 | HR 72 | Temp 98.0°F | Ht 69.0 in | Wt 231.8 lb

## 2017-11-10 DIAGNOSIS — I10 Essential (primary) hypertension: Secondary | ICD-10-CM

## 2017-11-10 DIAGNOSIS — M79675 Pain in left toe(s): Secondary | ICD-10-CM | POA: Diagnosis not present

## 2017-11-10 DIAGNOSIS — E785 Hyperlipidemia, unspecified: Secondary | ICD-10-CM | POA: Insufficient documentation

## 2017-11-10 DIAGNOSIS — R202 Paresthesia of skin: Secondary | ICD-10-CM | POA: Diagnosis not present

## 2017-11-10 DIAGNOSIS — M79674 Pain in right toe(s): Secondary | ICD-10-CM | POA: Diagnosis not present

## 2017-11-10 LAB — BASIC METABOLIC PANEL
BUN: 5 mg/dL — ABNORMAL LOW (ref 6–23)
CALCIUM: 9.4 mg/dL (ref 8.4–10.5)
CO2: 29 mEq/L (ref 19–32)
CREATININE: 0.87 mg/dL (ref 0.40–1.50)
Chloride: 102 mEq/L (ref 96–112)
GFR: 93.23 mL/min (ref >60.00)
GLUCOSE: 112 mg/dL — AB (ref 70–99)
Potassium: 3.7 mEq/L (ref 3.5–5.1)
SODIUM: 141 meq/L (ref 135–145)

## 2017-11-10 LAB — URIC ACID: Uric Acid, Serum: 7.7 mg/dL (ref 4.0–7.8)

## 2017-11-10 MED ORDER — HYDRALAZINE HCL 25 MG PO TABS
25.0000 mg | ORAL_TABLET | Freq: Three times a day (TID) | ORAL | 1 refills | Status: AC | PRN
Start: 2017-11-10 — End: ?

## 2017-11-10 MED ORDER — METOPROLOL SUCCINATE ER 200 MG PO TB24: 200.0000 mg | ORAL_TABLET | Freq: Every day | ORAL | 3 refills | Status: AC

## 2017-11-10 MED ORDER — ATORVASTATIN CALCIUM 20 MG PO TABS: 20.0000 mg | ORAL_TABLET | Freq: Every evening | ORAL | 3 refills | Status: AC

## 2017-11-10 MED ORDER — COLCHICINE 0.6 MG PO CAPS: ORAL_CAPSULE | ORAL | 0 refills | Status: AC

## 2017-11-10 NOTE — Telephone Encounter (Signed)
Patient on schedule to see Dr. Jimmey RalphParker today at 1:40 pm.

## 2017-11-10 NOTE — Progress Notes (Signed)
   Subjective:  Shane Lynch is a 67 y.o. male who presents today for same-day appointment with a chief complaint of elevated blood pressure.   HPI:  Hypertension, established problem, uncontrolled BP Readings from Last 3 Encounters:  11/10/17 (!) 152/88  10/27/17 (!) 148/92  08/27/17 116/80  Patient seen about 2 weeks ago for this.  At that time had elevated blood pressure to 140/92.  We added losartan 50 mg to his regimen.  He is also on metoprolol succinate 100 mg daily.  Over the last several weeks, he has noticed fluctuating blood pressures with readings being as high as the 180s over 100s.  He has occasional hand tingling and palpitations when his blood pressure gets high.  Toe pain/swelling Patient also with several months to year history of intermittent great toe swelling.  States that his bilateral toes will become very red, swollen, and tender for a few days and then resolve spontaneously.  Notes that his symptoms are worse after drinking beer.  He is concerned that he may have gout.  He has never been diagnosed with this in the past.  Hyperlipidemia, established problem, stable Stable on atorvastatin 20 mg daily.  ROS: Per HPI  PMH: He reports that he has never smoked. He has never used smokeless tobacco. He reports that he drinks alcohol. He reports that he does not use drugs.  Objective:  Physical Exam: BP (!) 152/88 (BP Location: Left Arm, Patient Position: Sitting, Cuff Size: Normal)   Pulse 72   Temp 98 F (36.7 C) (Oral)   Ht 5\' 9"  (1.753 m)   Wt 231 lb 12.8 oz (105.1 kg)   SpO2 96%   BMI 34.23 kg/m   Gen: NAD, resting comfortably CV: RRR with no murmurs appreciated.  No carotid bruits. Pulm: NWOB, CTAB with no crackles, wheezes, or rhonchi MSK: No edema, cyanosis, or clubbing noted.  -Upper extremities: Phalen and Tinel sign negative at bilateral wrist.  Spurling test positive bilaterally. Skin: Warm, dry  Assessment/Plan:  Hypertension Above goal  today.  Patient very anxious about his blood pressures which could be in turn increasing his blood pressure as well.  We will increase his metoprolol succinate to 200 mg daily.  Continue losartan 50 mg daily.  Check BMET today.  Given that he will be moving out of the state next week, we will also send in a prescription for hydralazine 25 mg 3 times daily as needed for blood pressure above 160/90.  Dyslipidemia Atorvastatin refilled.  Hand Tingling No red flag signs or symptoms.  Likely secondary to degenerative disc disease in the cervical spine with cervical radiculopathy given that he has bilateral positive Spurling.  Continue with watchful waiting.  Will consider imaging if symptoms worsen.  Toe Pain Symptoms consistent with gout, though he does not have any signs of acute flare today.  Will check uric acid level today.  We have simple colchicine with instruction to start at first sign of flare.  May need allopurinol depending uric acid level.  Time Spent: I spent >40 minutes face-to-face with the patient, with more than half spent on counseling for management for his hypertension, toe pain/swelling, and hand tingling.  Katina Degreealeb M. Jimmey RalphParker, MD 11/10/2017 5:04 PM

## 2017-11-10 NOTE — Patient Instructions (Signed)
It was very nice to see you today!  We will change your metoprolol to 200 mg daily.  Please check your pill bottle at home and make sure you are currently taking 100 mg daily.  I will refill your atorvastatin.  I will also send in a medication called hydralazine.  Please only use this medication if your blood pressure is more than 160/90.  You can use this up to 3 times daily.  We will check your uric acid level today.  I will also give you a sample of a medication called colchicine.  Please take 2 pills at the first sign of a gout flare.  Take 1 pill an hour later.  Then take 1 pill daily until your symptoms resolved.  Take care, Dr Jimmey RalphParker

## 2017-11-10 NOTE — Assessment & Plan Note (Signed)
Atorvastatin refilled.  

## 2017-11-10 NOTE — Assessment & Plan Note (Signed)
Above goal today.  Patient very anxious about his blood pressures which could be in turn increasing his blood pressure as well.  We will increase his metoprolol succinate to 200 mg daily.  Continue losartan 50 mg daily.  Check BMET today.  Given that he will be moving out of the state next week, we will also send in a prescription for hydralazine 25 mg 3 times daily as needed for blood pressure above 160/90.

## 2017-11-11 ENCOUNTER — Encounter: Payer: Self-pay | Admitting: Family Medicine

## 2017-11-12 ENCOUNTER — Other Ambulatory Visit: Payer: Self-pay

## 2017-11-12 ENCOUNTER — Other Ambulatory Visit: Payer: Self-pay | Admitting: Family Medicine

## 2017-11-12 MED ORDER — ALLOPURINOL 100 MG PO TABS: 100.0000 mg | ORAL_TABLET | Freq: Every day | ORAL | 4 refills | Status: AC

## 2017-11-16 ENCOUNTER — Ambulatory Visit: Payer: Self-pay

## 2017-11-16 ENCOUNTER — Encounter: Payer: Self-pay | Admitting: Family Medicine

## 2017-11-16 NOTE — Telephone Encounter (Signed)
See note

## 2017-11-16 NOTE — Telephone Encounter (Signed)
Pt. Reports he has elevated BP readings this weekend. Has not missed any doses of his medications. Reports some diarrhea over the weekend. States he is "very concerned about these high readings." E-mailed provider this morning and he responded with instructions on increasing his BP meds. Reviewed these with pt. And he verbalizes understanding. Instructed to recheck BP at 2 p.m. Answer Assessment - Initial Assessment Questions 1. BLOOD PRESSURE: "What is the blood pressure?" "Did you take at least two measurements 5 minutes apart?"     181/101  Pulse 74 2. ONSET: "When did you take your blood pressure?"     10:30 3. HOW: "How did you obtain the blood pressure?" (e.g., visiting nurse, automatic home BP monitor)     Home BP monitor. 4. HISTORY: "Do you have a history of high blood pressure?"     Yes 5. MEDICATIONS: "Are you taking any medications for blood pressure?" "Have you missed any doses recently?"     No 6. OTHER SYMPTOMS: "Do you have any symptoms?" (e.g., headache, chest pain, blurred vision, difficulty breathing, weakness)     "I feel shaky." 7. PREGNANCY: "Is there any chance you are pregnant?" "When was your last menstrual period?"     N/A  Protocols used: HIGH BLOOD PRESSURE-A-AH

## 2017-11-16 NOTE — Telephone Encounter (Signed)
I have responded to patient via MyChart.

## 2017-11-17 ENCOUNTER — Ambulatory Visit: Payer: Self-pay | Admitting: *Deleted

## 2017-11-17 ENCOUNTER — Ambulatory Visit: Payer: Medicare Other | Admitting: Family Medicine

## 2017-11-17 NOTE — Telephone Encounter (Signed)
Pt reports B/P this AM 174/98, took 300mg  of Toprol.  At 1000 took hydralazine 25mg . Presently B/P 195/97. States feels flushed, mild tingling of fingers. Also reports "jerking at night, like twitching but strong." Denies any headache, CP, SOB, one sided weakness. No visual changes. Pt has been consulting with Dr. Jimmey RalphParker via MyChart. Appt made for this afternoon at 1340. Pt questioning if needs to be seen or should he increase his Toprol to 400mg  as pt states was previously discussed. NT attempted call to practice. Pt sounds very anxious re: readings.  Pt would like call back soon as takes 40 minutes to get to appt. Please advise. Thank you   # [phone].    Reason for Disposition . Systolic BP  >= 180 OR Diastolic >= 110  Answer Assessment - Initial Assessment Questions 1. BLOOD PRESSURE: "What is the blood pressure?" "Did you take at least two measurements 5 minutes apart?"     195/97, few minutes ago. 2. ONSET: "When did you take your blood pressure?"     now 3. HOW: "How did you obtain the blood pressure?" (e.g., visiting nurse, automatic home BP monitor)    Home monitor 4. HISTORY: "Do you have a history of high blood pressure?"     H/O 5. MEDICATIONS: "Are you taking any medications for blood pressure?" "Have you missed any doses recently?"     yes 6. OTHER SYMPTOMS: "Do you have any symptoms?" (e.g., headache, chest pain, blurred vision, difficulty breathing, weakness)     Mild tingling fingers, flushed  Protocols used: HIGH BLOOD PRESSURE-A-AH

## 2017-11-17 NOTE — Telephone Encounter (Signed)
Per Dr. Jimmey RalphParker, ok for patient to increase his metoprolol to 400 mg and cancel appointment.  Spoke with patient and gave advice.  Patient verbalized understanding.  Appointment for today was cancelled and tentative appointment was made for tomorrow at 10:40 am in case patient is not doing better and needs to be seen.  Patient understands that he needs to go to the ED if his symptoms worsen or he develops any one sided weakness, numbness, difficulty speaking, etc.

## 2017-11-18 ENCOUNTER — Telehealth: Payer: Self-pay | Admitting: Family Medicine

## 2017-11-18 ENCOUNTER — Ambulatory Visit: Payer: Medicare Other | Admitting: Family Medicine

## 2017-11-18 NOTE — Telephone Encounter (Signed)
Copied from CRM [phone]. Topic: General - Other >> Nov 18, 2017  8:20 AM Percival SpanishKennedy, Cheryl W wrote:  Pt would like Amber to give him a car. He said they had a conversation yesterday   [phone] >> Nov 18, 2017  1:38 PM Rudi CocoLathan, Kyleen Villatoro M, [place] wrote: Pt. Calling back to speak with amber

## 2017-11-19 NOTE — Telephone Encounter (Signed)
Would give metoprolol a few more days before making any changes to any of his medications.   If still elevated, would recommend increasing his losartan to 100mg .   Katina Degreealeb M. Jimmey RalphParker, MD 11/19/2017 9:21 AM

## 2017-11-19 NOTE — Telephone Encounter (Signed)
Spoke with patient.  He states he increased his metoprolol to 400 mg.  He feels better overall, but his blood pressure is still high.  Would like to know of any other suggestions before leaving for [place] tomorrow.  11/17/2017:  174/98 174/87 156/99 150/86  11/18/2017:  196/110 186/105 179/100 158/84  11/19/2017: 174/93  Patient states he feels "great" because he is no longer shaky, his feet are not swollen, and he can walk without difficulty, but he is still concerned because his blood pressure continues to be high.  Please advise.

## 2017-11-19 NOTE — Telephone Encounter (Signed)
I have responded to the patient via MyChart. 

## 2017-12-17 ENCOUNTER — Ambulatory Visit: Payer: Self-pay | Admitting: *Deleted

## 2017-12-17 DIAGNOSIS — R0789 Other chest pain: Secondary | ICD-10-CM | POA: Diagnosis not present

## 2017-12-17 DIAGNOSIS — Z885 Allergy status to narcotic agent status: Secondary | ICD-10-CM | POA: Diagnosis not present

## 2017-12-17 DIAGNOSIS — I10 Essential (primary) hypertension: Secondary | ICD-10-CM | POA: Diagnosis not present

## 2017-12-17 DIAGNOSIS — R51 Headache: Secondary | ICD-10-CM | POA: Diagnosis not present

## 2017-12-17 DIAGNOSIS — I700 Atherosclerosis of aorta: Secondary | ICD-10-CM | POA: Diagnosis not present

## 2017-12-17 DIAGNOSIS — R600 Localized edema: Secondary | ICD-10-CM | POA: Diagnosis not present

## 2017-12-17 DIAGNOSIS — E7800 Pure hypercholesterolemia, unspecified: Secondary | ICD-10-CM | POA: Diagnosis not present

## 2017-12-17 DIAGNOSIS — M79602 Pain in left arm: Secondary | ICD-10-CM | POA: Diagnosis not present

## 2017-12-17 DIAGNOSIS — I517 Cardiomegaly: Secondary | ICD-10-CM | POA: Diagnosis not present

## 2017-12-17 DIAGNOSIS — R072 Precordial pain: Secondary | ICD-10-CM | POA: Diagnosis not present

## 2017-12-17 DIAGNOSIS — M542 Cervicalgia: Secondary | ICD-10-CM | POA: Diagnosis not present

## 2017-12-17 DIAGNOSIS — R0602 Shortness of breath: Secondary | ICD-10-CM | POA: Diagnosis not present

## 2017-12-17 DIAGNOSIS — E785 Hyperlipidemia, unspecified: Secondary | ICD-10-CM | POA: Diagnosis not present

## 2017-12-17 DIAGNOSIS — E669 Obesity, unspecified: Secondary | ICD-10-CM | POA: Diagnosis not present

## 2017-12-17 DIAGNOSIS — R079 Chest pain, unspecified: Secondary | ICD-10-CM | POA: Diagnosis not present

## 2017-12-17 DIAGNOSIS — G4733 Obstructive sleep apnea (adult) (pediatric): Secondary | ICD-10-CM | POA: Diagnosis not present

## 2017-12-17 DIAGNOSIS — R4182 Altered mental status, unspecified: Secondary | ICD-10-CM | POA: Diagnosis not present

## 2017-12-17 NOTE — Telephone Encounter (Signed)
Spoke with patient and advised that there is nothing Dr. Jimmey RalphParker can do for him here in [place] and that he needs to go be evaluated in the ED, as he was told earlier by the triage nurse.  Patient verbalized understanding and stated that he would go to the ED.

## 2017-12-17 NOTE — Telephone Encounter (Signed)
See note

## 2017-12-17 NOTE — Telephone Encounter (Signed)
Pt called with elevations in his b/p. He stated that with the increase in his b/p med it is still up. He also is taking his prn med (hydralazine 25 mg) when is b/p is > 160/90. But he had not been taking it tid which if he needs it.  Pt has doubled up on his other b/p meds losartan 50 mg tab and metoprolol 200 mg tab. He denies having symptoms other than headache. Advised him that he needs to be seen in the ED with a b/p this high and definitely if he starts having more symptoms; weakness, blurred vision, chest pain, difficulty breathing. Pt voiced understanding.  He now lives in [place] and had not found a pcp yet. Request that Dr. Jimmey RalphParker give him a call back.  [facility] [place].  Reason for Disposition . Systolic BP  >= 180 OR Diastolic >= 110  Answer Assessment - Initial Assessment Questions 1. BLOOD PRESSURE: "What is the blood pressure?" "Did you take at least two measurements 5 minutes apart?"    197/111 HR 77 (4hours ago), 182/105 HR 73 and now 197/106 HR 75 2. ONSET: "When did you take your blood pressure?"     4 hours ago and now 3. HOW: "How did you obtain the blood pressure?" (e.g., visiting nurse, automatic home BP monitor)     Automatic home BP monitor 4. HISTORY: "Do you have a history of high blood pressure?"     yes 5. MEDICATIONS: "Are you taking any medications for blood pressure?" "Have you missed any doses recently?"     None missed 6. OTHER SYMPTOMS: "Do you have any symptoms?" (e.g., headache, chest pain, blurred vision, difficulty breathing, weakness)     headache  Protocols used: HIGH BLOOD PRESSURE-A-AH

## 2017-12-18 DIAGNOSIS — G4733 Obstructive sleep apnea (adult) (pediatric): Secondary | ICD-10-CM | POA: Diagnosis not present

## 2017-12-18 DIAGNOSIS — E669 Obesity, unspecified: Secondary | ICD-10-CM | POA: Diagnosis not present

## 2017-12-18 DIAGNOSIS — I119 Hypertensive heart disease without heart failure: Secondary | ICD-10-CM | POA: Diagnosis not present

## 2017-12-18 DIAGNOSIS — I10 Essential (primary) hypertension: Secondary | ICD-10-CM | POA: Diagnosis not present

## 2017-12-18 DIAGNOSIS — R0789 Other chest pain: Secondary | ICD-10-CM | POA: Diagnosis not present

## 2017-12-18 DIAGNOSIS — E785 Hyperlipidemia, unspecified: Secondary | ICD-10-CM | POA: Diagnosis not present

## 2017-12-18 DIAGNOSIS — R9431 Abnormal electrocardiogram [ECG] [EKG]: Secondary | ICD-10-CM | POA: Diagnosis not present

## 2018-01-05 DIAGNOSIS — I10 Essential (primary) hypertension: Secondary | ICD-10-CM | POA: Diagnosis not present

## 2018-01-05 DIAGNOSIS — Z6833 Body mass index (BMI) 33.0-33.9, adult: Secondary | ICD-10-CM | POA: Diagnosis not present

## 2018-01-05 DIAGNOSIS — Z9989 Dependence on other enabling machines and devices: Secondary | ICD-10-CM | POA: Diagnosis not present

## 2018-01-05 DIAGNOSIS — E669 Obesity, unspecified: Secondary | ICD-10-CM | POA: Diagnosis not present

## 2018-01-05 DIAGNOSIS — G4733 Obstructive sleep apnea (adult) (pediatric): Secondary | ICD-10-CM | POA: Diagnosis not present

## 2018-01-05 DIAGNOSIS — M1A0720 Idiopathic chronic gout, left ankle and foot, without tophus (tophi): Secondary | ICD-10-CM | POA: Diagnosis not present

## 2018-01-06 DIAGNOSIS — E785 Hyperlipidemia, unspecified: Secondary | ICD-10-CM | POA: Diagnosis not present

## 2018-01-06 DIAGNOSIS — E669 Obesity, unspecified: Secondary | ICD-10-CM | POA: Diagnosis not present

## 2018-01-06 DIAGNOSIS — G4733 Obstructive sleep apnea (adult) (pediatric): Secondary | ICD-10-CM | POA: Diagnosis not present

## 2018-01-06 DIAGNOSIS — I10 Essential (primary) hypertension: Secondary | ICD-10-CM | POA: Diagnosis not present

## 2018-01-26 IMAGING — DX DG ABDOMEN 1V
2 series · 2 of 2 positions shown · non-contrast
Comparison: Abdominal radiographs of October 17, 2014

CLINICAL DATA: Givens capsule study. Patient ports not seen the
pill during a bowel movement. Patient complains of lower quadrant
discomfort.

EXAM:
ABDOMEN - 1 VIEW

[abdomen kub (1 of 2)]
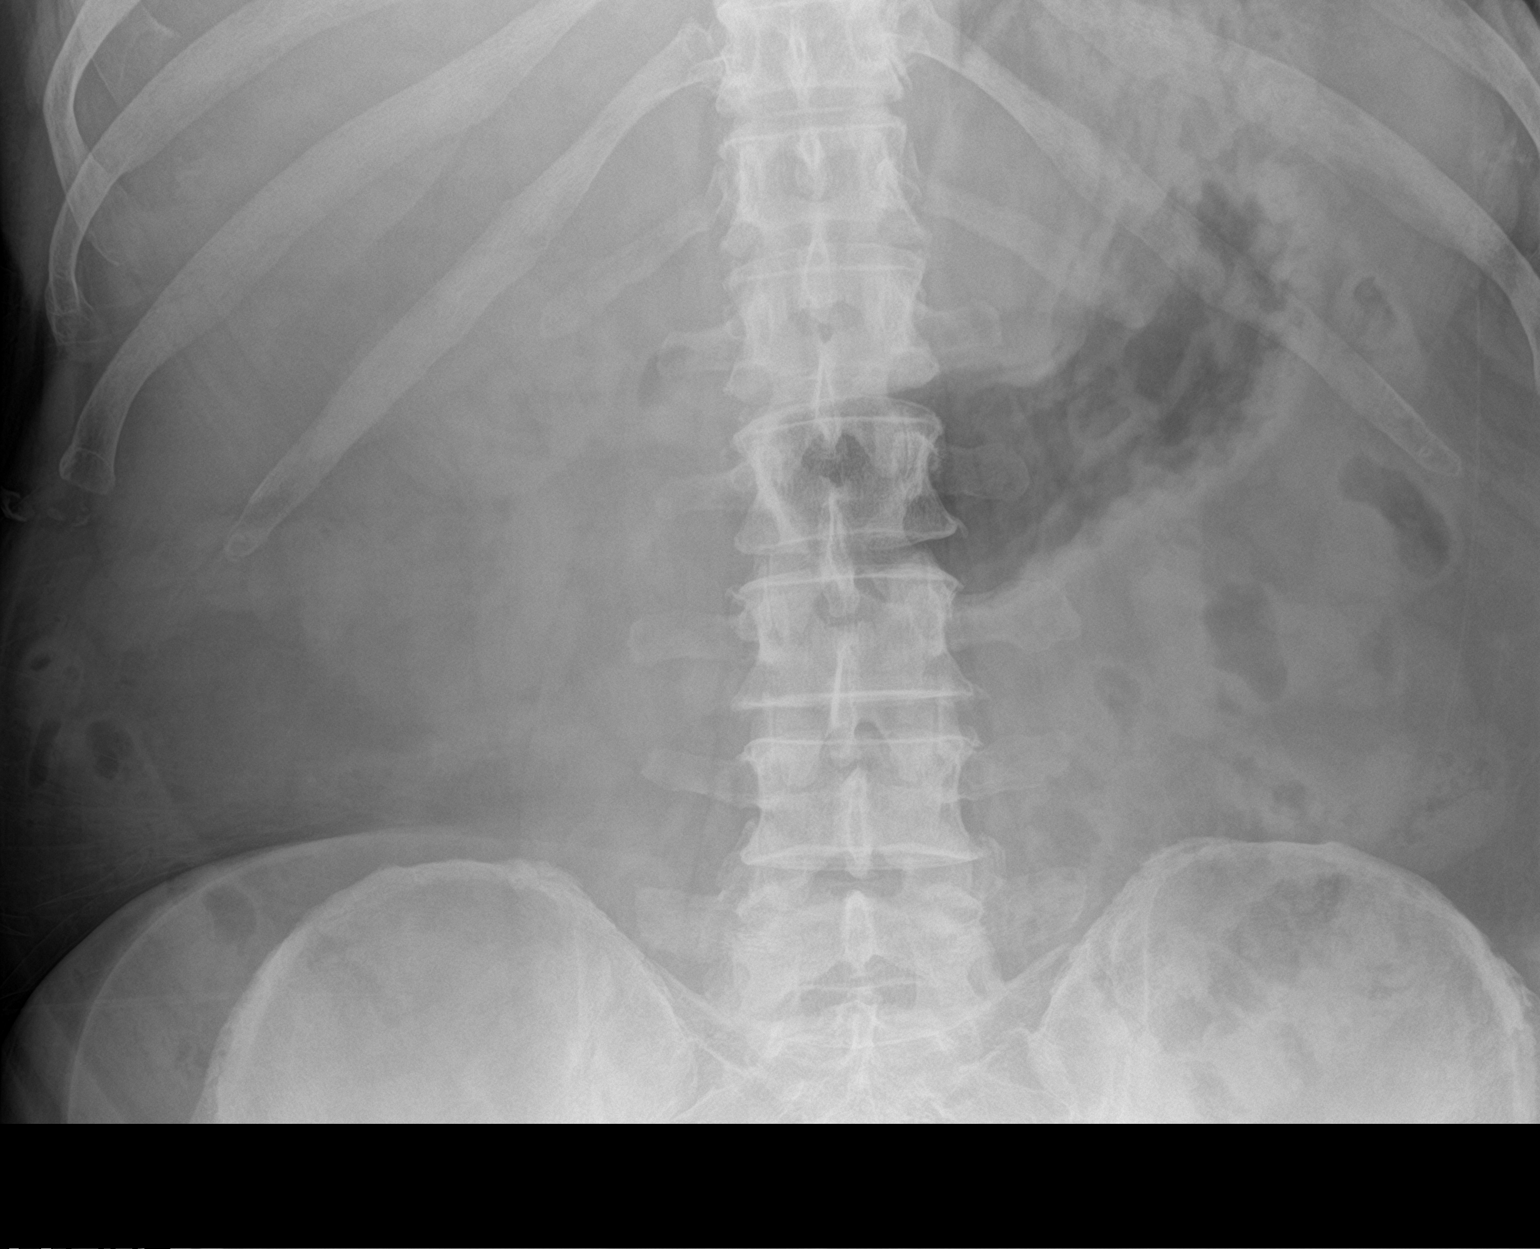

[abdomen kub (2 of 2)]
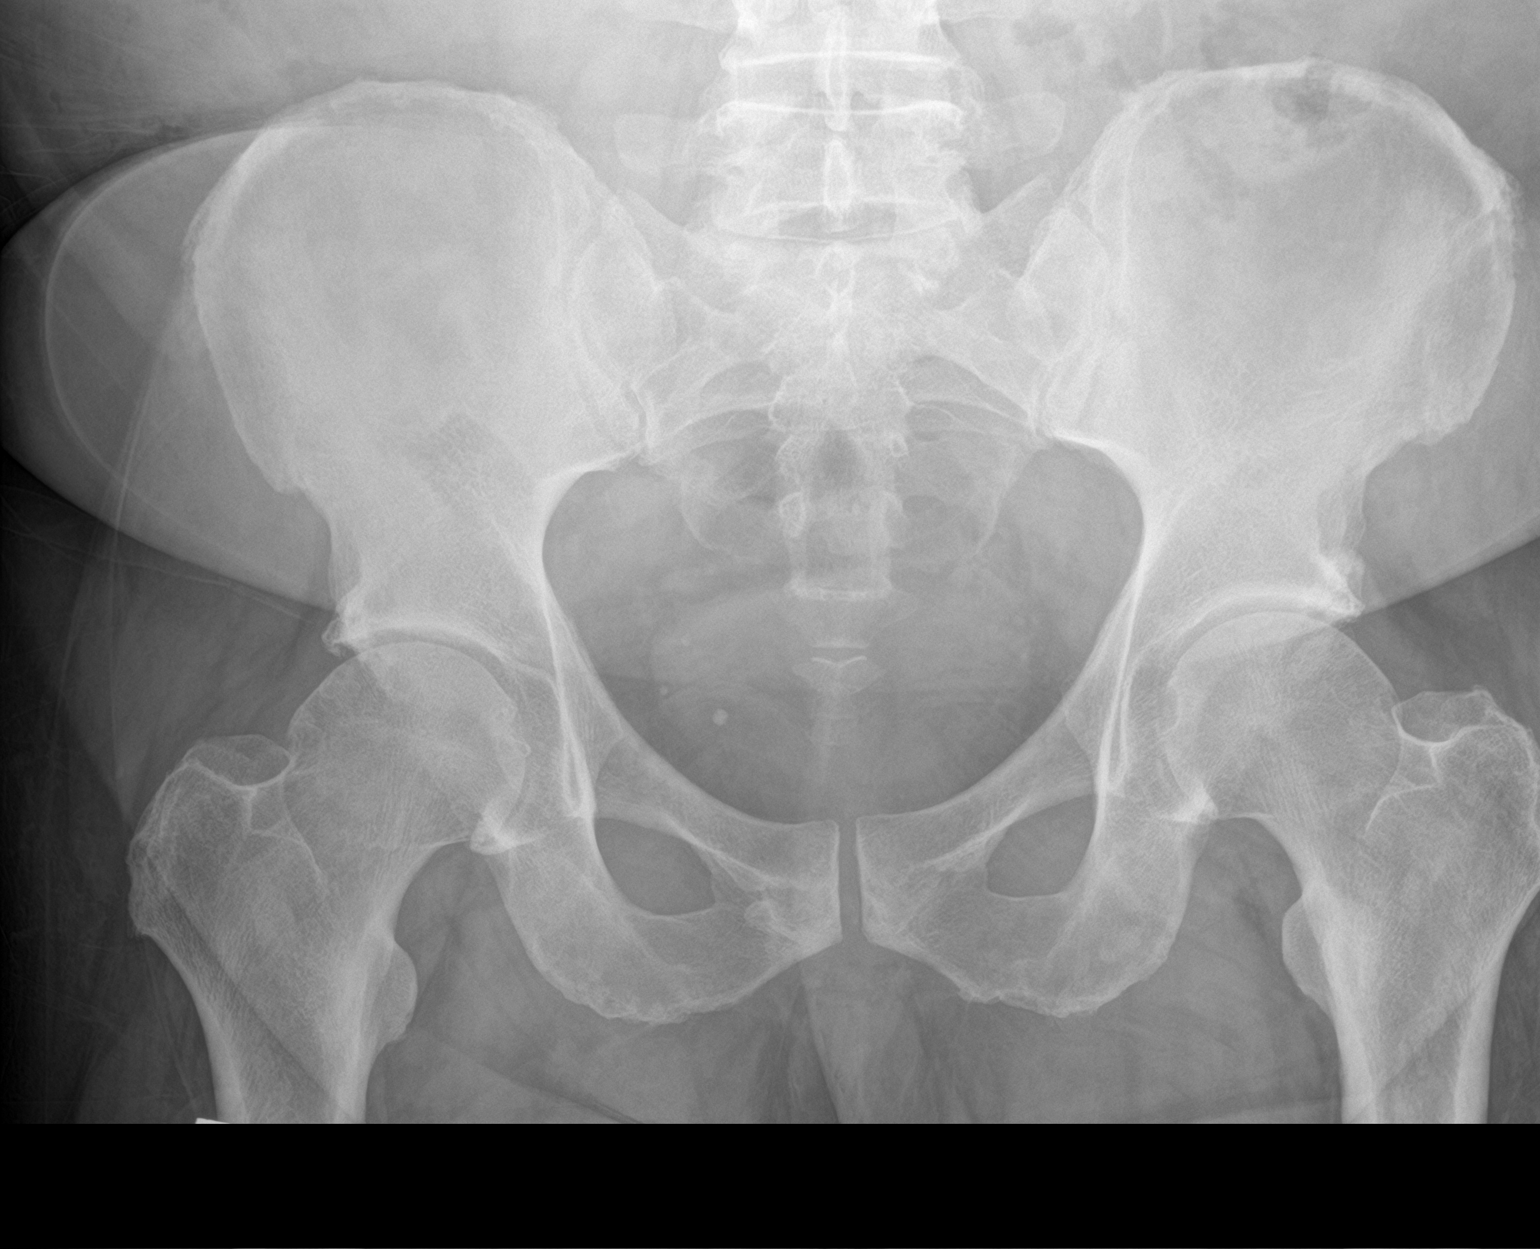

[2 of 2 positions shown; findings below may reference images not displayed]

FINDINGS: No radiopaque electronic capsule is observed over the abdomen or
pelvis. The bowel gas pattern is normal. There are no abnormal soft
tissue calcifications. There are phleboliths within the pelvis. The
bony structures exhibit no acute abnormalities.
IMPRESSION: There is no evidence of the endoscopic capsule on these 2 images of
the abdomen and pelvis.

## 2018-02-25 ENCOUNTER — Telehealth: Payer: Self-pay | Admitting: Family Medicine

## 2018-02-25 NOTE — Telephone Encounter (Signed)
See note

## 2018-02-25 NOTE — Telephone Encounter (Signed)
Copied from CRM [id number]. Topic: Quick Communication - See Telephone Encounter >> Feb 25, 2018  2:50 PM Maia Pettiesrtiz, Kristie S wrote: CRM for notification. See Telephone encounter for: 02/25/18. [facility] faxed nasal pillows order request to Dr. Jimmey RalphParker dating back to March 2019. Pt has established MD in [place] now but this is a back dated form. It wasn't signed previously so now pt is not able to reorder supplies. He states he cannot use his cpap without the pillows. Pt is requesting that Dr. Jimmey RalphParker sign and return the form so he is able to use the equipment. Please advise.

## 2018-03-01 NOTE — Telephone Encounter (Signed)
Called patient's cell phone and left detailed voicemail msg explaining Dr. Jimmey RalphParker is out of the office today, message will be sent to him to address and follow up on when he returns tomorrow 9/24.

## 2018-03-03 NOTE — Telephone Encounter (Signed)
Form has been signed and placed in fax folder for pick up.

## 2018-03-06 IMAGING — DX DG KNEE 1-2V*L*
2 series · 2 of 2 positions shown · non-contrast
Comparison: None.

CLINICAL DATA: 66-year-old male with left knee pain for 1 week
after are possible twisting injury while pulling garden host.

EXAM:
LEFT KNEE - 1-2 VIEW

[knee standing ap]
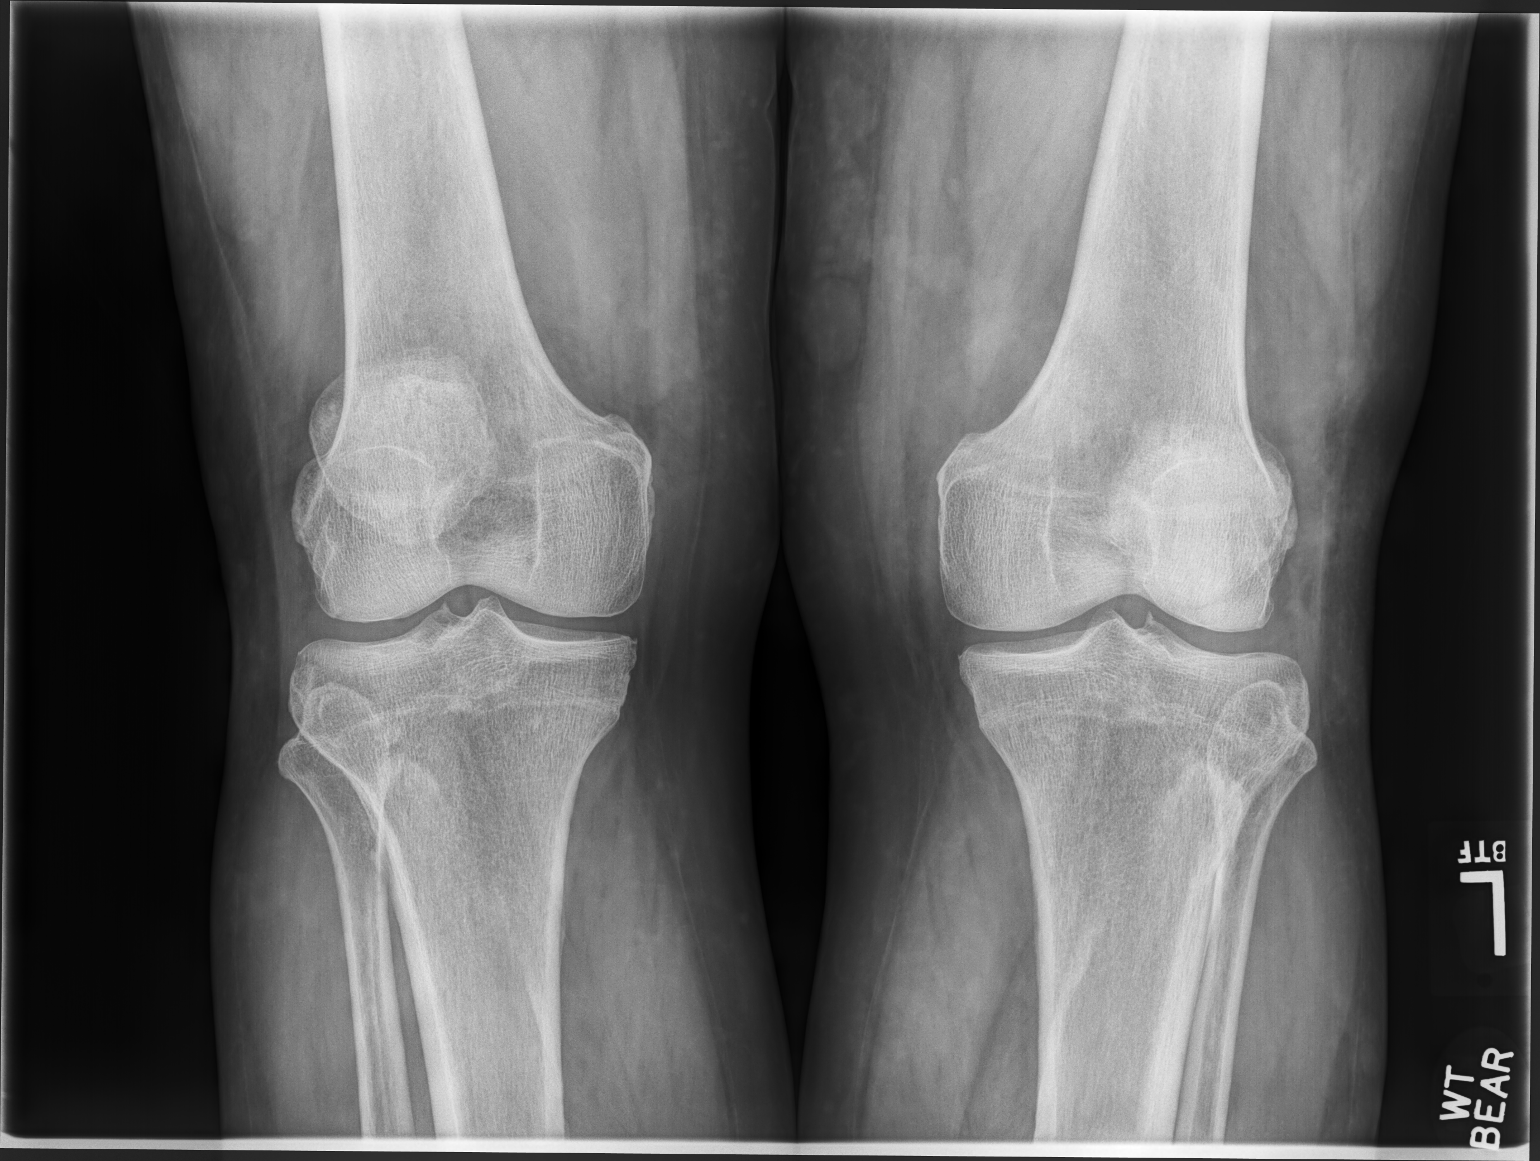

[knee standing lat]
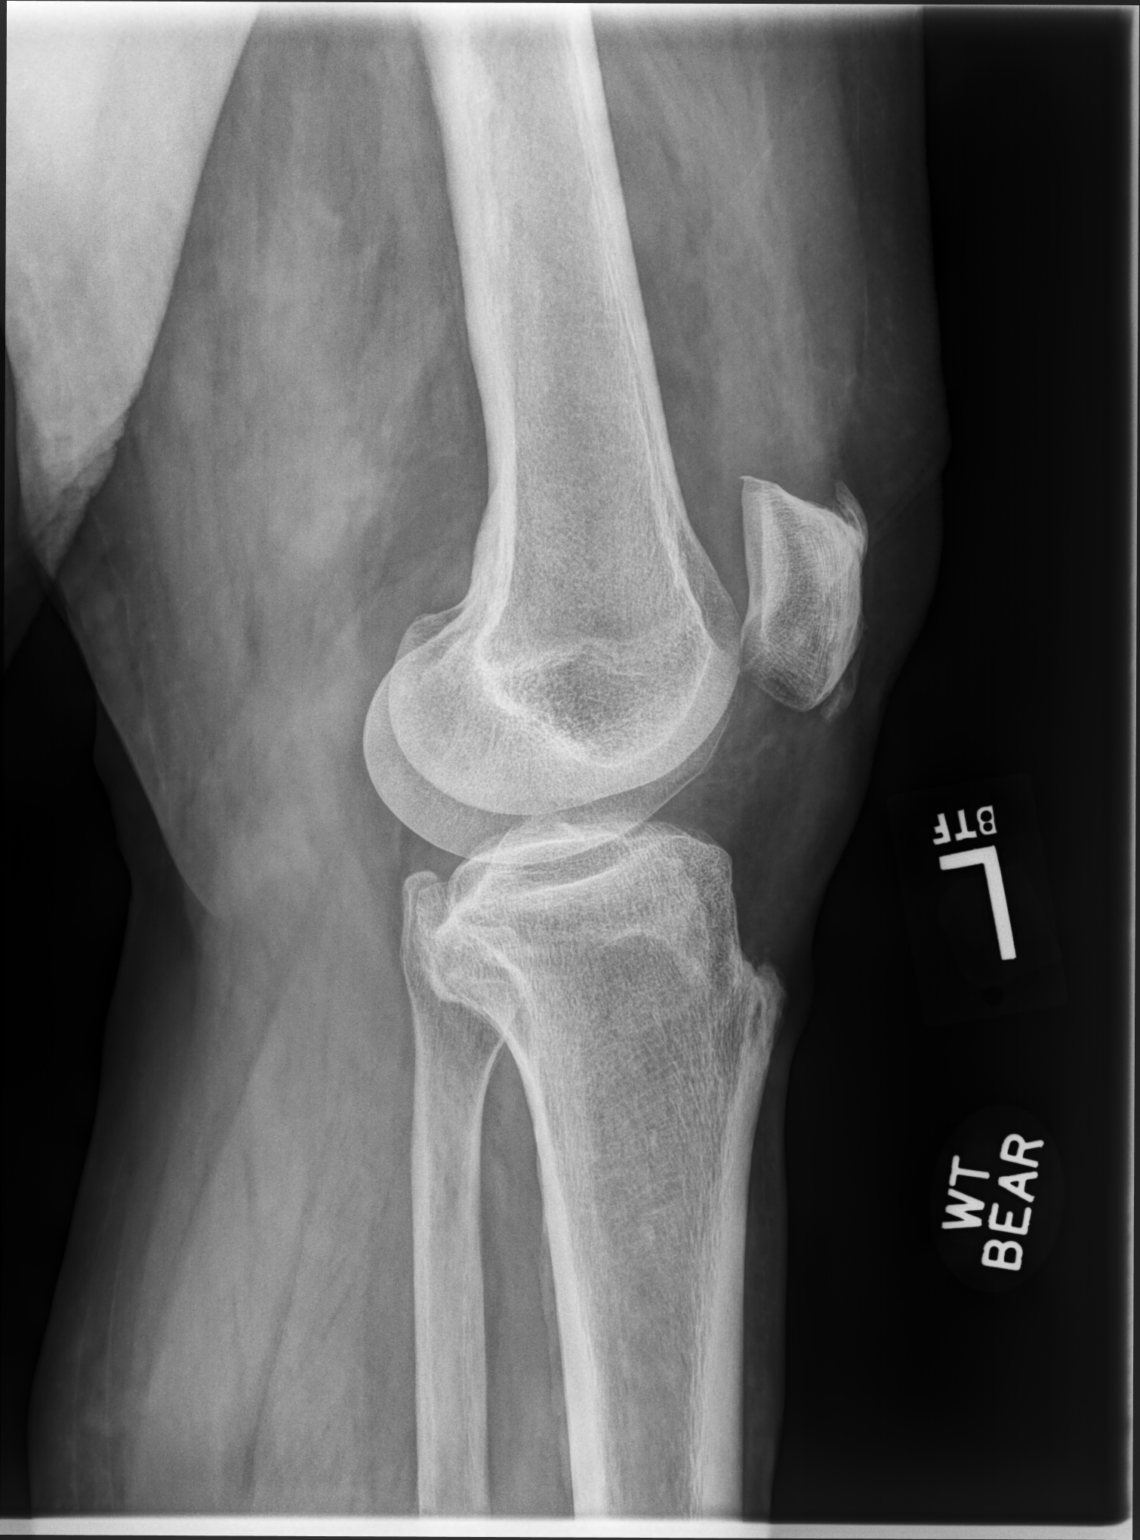

[2 of 2 positions shown; findings below may reference images not displayed]

FINDINGS: Standing AP view of both knees, and standing lateral view of the
left knee. Symmetric and normal for age medial and lateral joint
spaces. Mild tricompartmental degenerative spurring at the left
knee. Trace if any left knee joint effusion. The patella appears
intact. Normal underlying bone mineralization. No acute osseous
abnormality identified. Probable subcutaneous venous varicosities in
the medial left thigh (arrow).
IMPRESSION: 1. No acute osseous abnormality at the left knee. Possible trace
left knee joint effusion.
2. Mild for age left knee joint degeneration.
3. Probable subcutaneous venous varicosities in the medial left
thigh.

## 2018-03-09 ENCOUNTER — Other Ambulatory Visit: Payer: Self-pay | Admitting: Family Medicine

## 2018-03-16 DIAGNOSIS — Z23 Encounter for immunization: Secondary | ICD-10-CM | POA: Diagnosis not present

## 2018-04-05 DIAGNOSIS — I10 Essential (primary) hypertension: Secondary | ICD-10-CM | POA: Diagnosis not present

## 2018-04-05 DIAGNOSIS — M1A0720 Idiopathic chronic gout, left ankle and foot, without tophus (tophi): Secondary | ICD-10-CM | POA: Diagnosis not present

## 2018-04-09 DIAGNOSIS — Z0000 Encounter for general adult medical examination without abnormal findings: Secondary | ICD-10-CM | POA: Diagnosis not present

## 2018-04-09 DIAGNOSIS — G4733 Obstructive sleep apnea (adult) (pediatric): Secondary | ICD-10-CM | POA: Diagnosis not present

## 2018-04-09 DIAGNOSIS — Z6835 Body mass index (BMI) 35.0-35.9, adult: Secondary | ICD-10-CM | POA: Diagnosis not present

## 2018-04-09 DIAGNOSIS — M10079 Idiopathic gout, unspecified ankle and foot: Secondary | ICD-10-CM | POA: Diagnosis not present

## 2018-04-09 DIAGNOSIS — E785 Hyperlipidemia, unspecified: Secondary | ICD-10-CM | POA: Diagnosis not present

## 2018-04-09 DIAGNOSIS — I10 Essential (primary) hypertension: Secondary | ICD-10-CM | POA: Diagnosis not present

## 2018-05-03 DIAGNOSIS — Z125 Encounter for screening for malignant neoplasm of prostate: Secondary | ICD-10-CM | POA: Diagnosis not present

## 2018-12-31 ENCOUNTER — Other Ambulatory Visit: Payer: Self-pay
